# Patient Record
Sex: Male | Born: 1975 | Race: Black or African American | Hispanic: No | Marital: Single | Smoking: Never smoker
Health system: Southern US, Community
[De-identification: ages and names within clinical notes are randomized; demographics above are authoritative.]

## PROBLEM LIST (undated history)

## (undated) DIAGNOSIS — H409 Unspecified glaucoma: Secondary | ICD-10-CM

## (undated) HISTORY — DX: Unspecified glaucoma: H40.9

## (undated) HISTORY — PX: EYE SURGERY: SHX253

---

## 2009-06-18 ENCOUNTER — Encounter: Admission: RE | Admit: 2009-06-18 | Discharge: 2009-06-18 | Payer: Self-pay | Admitting: Internal Medicine

## 2010-09-15 ENCOUNTER — Other Ambulatory Visit: Payer: Self-pay | Admitting: Infectious Diseases

## 2010-09-15 ENCOUNTER — Ambulatory Visit
Admission: RE | Admit: 2010-09-15 | Discharge: 2010-09-15 | Disposition: A | Discharge status: Home / Self Care | Payer: PRIVATE HEALTH INSURANCE | Source: Ambulatory Visit | Attending: Infectious Diseases | Admitting: Infectious Diseases

## 2010-09-15 DIAGNOSIS — R7611 Nonspecific reaction to tuberculin skin test without active tuberculosis: Secondary | ICD-10-CM

## 2010-10-19 ENCOUNTER — Other Ambulatory Visit: Payer: Self-pay | Admitting: General Practice

## 2010-10-19 ENCOUNTER — Ambulatory Visit
Admission: RE | Admit: 2010-10-19 | Discharge: 2010-10-19 | Disposition: A | Discharge status: Home / Self Care | Payer: Self-pay | Source: Ambulatory Visit | Attending: General Practice | Admitting: General Practice

## 2010-10-19 DIAGNOSIS — M549 Dorsalgia, unspecified: Secondary | ICD-10-CM

## 2010-10-21 ENCOUNTER — Ambulatory Visit: Payer: Self-pay | Attending: General Practice

## 2010-10-21 DIAGNOSIS — M545 Low back pain, unspecified: Secondary | ICD-10-CM | POA: Insufficient documentation

## 2010-10-21 DIAGNOSIS — IMO0001 Reserved for inherently not codable concepts without codable children: Secondary | ICD-10-CM | POA: Insufficient documentation

## 2010-10-21 DIAGNOSIS — M546 Pain in thoracic spine: Secondary | ICD-10-CM | POA: Insufficient documentation

## 2010-10-21 DIAGNOSIS — M79609 Pain in unspecified limb: Secondary | ICD-10-CM | POA: Insufficient documentation
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
## 2016-09-26 ENCOUNTER — Emergency Department (HOSPITAL_COMMUNITY)
Admission: EM | Admit: 2016-09-26 | Discharge: 2016-09-27 | Disposition: A | Discharge status: Home / Self Care | Payer: Self-pay | Attending: Emergency Medicine | Admitting: Emergency Medicine

## 2016-09-26 ENCOUNTER — Encounter (HOSPITAL_COMMUNITY): Payer: Self-pay

## 2016-09-26 DIAGNOSIS — M5441 Lumbago with sciatica, right side: Secondary | ICD-10-CM | POA: Insufficient documentation

## 2016-09-26 MED ORDER — OXYCODONE-ACETAMINOPHEN 5-325 MG PO TABS
1.0000 | ORAL_TABLET | Freq: Once | ORAL | Status: AC
  Administered 2016-09-26: 1 via ORAL
  Filled 2016-09-26: qty 1

## 2016-09-26 NOTE — ED Provider Notes (Signed)
[facility] Provider Note   CSN: [id number] Arrival date & time: 09/26/16  1942     History   Chief Complaint Chief Complaint  Patient presents with  . Back Pain    HPI Patrick King is a 41 y.o. male.  The history is provided by the patient.  He complains of low back pain for the last 3 days. Pain radiates to the right leg and also to the right lateral abdomen. Pain does wax and wane. When he gets severe, he will have some chills. There is no nausea or vomiting. He denies any urinary difficulty. He denies weakness, numbness, tingling. He had been doing a lot of lifting because he was moving. He has not had back problems previously. He is taken acetaminophen at home without relief. He was given a dose of oxycodone-acetaminophen in the waiting room, with significant relief. He rates his pain currently at 5/10, but was 8/10 at its worst.  History reviewed. No pertinent past medical history.  There are no active problems to display for this patient.   History reviewed. No pertinent surgical history.     Home Medications    Prior to Admission medications   Not on File    Family History History reviewed. No pertinent family history.  Social History Social History  Substance Use Topics  . Smoking status: Never Smoker  . Smokeless tobacco: Never Used  . Alcohol use No     Allergies   Patient has no allergy information on record.   Review of Systems Review of Systems  All other systems reviewed and are negative.    Physical Exam Updated Vital Signs BP (!) 144/93 (BP Location: Left Arm)   Pulse 61   Temp 97.7 F (36.5 C) (Oral)   Resp 18   Ht 5\' 7"  (1.702 m)   Wt 90.3 kg (199 lb 1.6 oz)   SpO2 100%   BMI 31.18 kg/m   Physical Exam  Nursing note and vitals reviewed.  41 year old male, resting comfortably and in no acute distress. Vital signs are significant for hypertension. Oxygen saturation is 100%, which is normal. Head is normocephalic  and atraumatic. PERRLA, EOMI. Oropharynx is clear. Neck is nontender and supple without adenopathy or JVD. Back is nontender. There is moderate bilateral paralumbar spasm. Straight leg raise is negative. There is no CVA tenderness. Lungs are clear without rales, wheezes, or rhonchi. Chest is nontender. Heart has regular rate and rhythm without murmur. Abdomen is soft, flat, nontender without masses or hepatosplenomegaly and peristalsis is normoactive. Extremities have no cyanosis or edema, full range of motion is present. Skin is warm and dry without rash. Neurologic: Mental status is normal, cranial nerves are intact, there are no motor or sensory deficits.   Procedures Procedures (including critical care time)  Medications Ordered in ED Medications  ibuprofen (ADVIL,MOTRIN) tablet 800 mg (not administered)  cyclobenzaprine (FLEXERIL) tablet 10 mg (not administered)  oxyCODONE-acetaminophen (PERCOCET/ROXICET) 5-325 MG per tablet 1 tablet (1 tablet Oral Given 09/26/16 2115)     Initial Impression / Assessment and Plan / ED Course  I have reviewed the triage vital signs and the nursing notes.  Low back pain with right-sided sciatica. No red flags to suggest more serious problems. Old records are reviewed, and he has no relevant past visits. He is discharged with prescription for naproxen, orphenadrine, oxycodone have acetaminophen. Return precautions discussed. No indications for imaging today.  Final Clinical Impressions(s) / ED Diagnoses   Final diagnoses:  Acute bilateral  low back pain with right-sided sciatica    New Prescriptions New Prescriptions   NAPROXEN (NAPROSYN) 500 MG TABLET    Take 1 tablet (500 mg total) by mouth 2 (two) times daily.   ORPHENADRINE (NORFLEX) 100 MG TABLET    Take 1 tablet (100 mg total) by mouth 2 (two) times daily.   OXYCODONE-ACETAMINOPHEN (PERCOCET) 5-325 MG TABLET    Take 1 tablet by mouth every 4 (four) hours as needed for moderate pain.       Dione Booze, MD 09/27/16 [phone]

## 2016-09-26 NOTE — ED Triage Notes (Signed)
Pt states that he has been having bilateral flank pain that radiates down to rt  leg, and to right side of abdomen 8/10 shooting and sharp x 3 days pt denies pain with urination, hematuria, dysuria.  Pt does report recently moving over the weekend and  A lot of lifting. Denies nausea vomiting.

## 2016-09-27 MED ORDER — IBUPROFEN 800 MG PO TABS
800.0000 mg | ORAL_TABLET | Freq: Once | ORAL | Status: AC
  Administered 2016-09-27: 800 mg via ORAL
  Filled 2016-09-27: qty 1

## 2016-09-27 MED ORDER — ORPHENADRINE CITRATE ER 100 MG PO TB12: 100.0000 mg | ORAL_TABLET | Freq: Two times a day (BID) | ORAL | 0 refills | Status: DC

## 2016-09-27 MED ORDER — OXYCODONE-ACETAMINOPHEN 5-325 MG PO TABS: 1.0000 | ORAL_TABLET | ORAL | 0 refills | Status: DC | PRN

## 2016-09-27 MED ORDER — NAPROXEN 500 MG PO TABS: 500.0000 mg | ORAL_TABLET | Freq: Two times a day (BID) | ORAL | 0 refills | Status: DC

## 2016-09-27 MED ORDER — CYCLOBENZAPRINE HCL 10 MG PO TABS
10.0000 mg | ORAL_TABLET | Freq: Once | ORAL | Status: AC
  Administered 2016-09-27: 10 mg via ORAL
  Filled 2016-09-27: qty 1

## 2016-09-27 NOTE — Discharge Instructions (Signed)
Apply ice several times a day. Return if pain is getting worse, or if new symptoms arise.

## 2016-09-27 NOTE — ED Notes (Signed)
Pt ambulatory and independent at discharge.  Verbalized understanding of discharge instructions and importance of not driving while under the influence of pain medication. 

## 2017-02-21 ENCOUNTER — Ambulatory Visit: Payer: PRIVATE HEALTH INSURANCE | Admitting: Family Medicine

## 2017-05-04 ENCOUNTER — Encounter: Payer: Self-pay | Admitting: Family Medicine

## 2017-05-04 ENCOUNTER — Ambulatory Visit: Payer: Managed Care, Other (non HMO) | Admitting: Family Medicine

## 2017-05-04 VITALS — BP 110/72 | HR 75 | Ht 68.5 in | Wt 203.0 lb

## 2017-05-04 DIAGNOSIS — H409 Unspecified glaucoma: Secondary | ICD-10-CM | POA: Diagnosis not present

## 2017-05-04 DIAGNOSIS — R142 Eructation: Secondary | ICD-10-CM | POA: Insufficient documentation

## 2017-05-04 NOTE — Patient Instructions (Signed)
Take 1 Dexilant 30 minutes before a meal once daily.   Try to avoid chewing gum, carbonated beverages, eating quickly.   Foods to avoid:   Onions, cabbage, broccoli, brussel sprouts, wheat and potatoes.   Follow up in 2 weeks FASTING

## 2017-05-04 NOTE — Progress Notes (Signed)
   Subjective:    Patient ID: Patrick King, male    DOB: [birth year], 42 y.o.   MRN: [id number]  HPI Chief Complaint  Patient presents with  . new pt    new pt, burping issues- burping every minute, stomach pain once a year- that he can remember, needs update shot records- which he has not had done   He is new to the practice and here for an acute complaint.  Previous medical care: No PCP  Other providers: Eye doctor is Dr. Melene MullerWhittaker on [address]   Reports having glaucoma.   Complains of burping for the past 2 years without abdominal pain, bloating, nausea, vomiting, diarrhea or constipation. Denies history of reflux and denies any symptoms of indigestion.  No unexplained weight loss.  No new medications or antibiotic use.  Reports having " An African diet" states he eats a lot of rice, vegetables and spicy food. He drinks soda.  States his diet has not changed.  [place] from [place] ago.  States he has tried to stop chewing gum to see if this helped and it did not.  Denies trying an acid.  States he has not seen anyone for his symptoms.  Denies fever, chills, chest pain palpitations, cough, shortness of breath, orthopnea.  Reviewed allergies, medications, past medical, surgical, family, and social history.  Review of Systems Pertinent positives and negatives in the history of present illness.     Objective:   Physical Exam BP 110/72   Pulse 75   Ht 5' 8.5" (1.74 m)   Wt 203 lb (92.1 kg)   BMI 30.42 kg/m   Alert and in no distress. Pharyngeal area is normal. Neck is supple without adenopathy or thyromegaly. Cardiac exam shows a regular sinus rhythm without murmurs or gallops. Lungs are clear to auscultation.  Abdomen is soft, nontender, nondistended, normal bowel sounds, no palpable masses.  Extremities without edema, normal pulses.      Assessment & Plan:  Belching  Glaucoma, unspecified glaucoma type, unspecified laterality Exam is  unremarkable. Discussed trying to reduce the amount of gas producing foods and habits.  He will try a 10-day course of Dexilant.  Samples given. Plan to have him return in 2 weeks for a follow-up and fasting CPE per his request.
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
## 2017-05-24 ENCOUNTER — Encounter: Payer: Managed Care, Other (non HMO) | Admitting: Family Medicine

## 2017-05-26 ENCOUNTER — Encounter: Payer: Self-pay | Admitting: Family Medicine

## 2017-11-05 ENCOUNTER — Other Ambulatory Visit: Payer: Self-pay

## 2017-11-05 ENCOUNTER — Ambulatory Visit (INDEPENDENT_AMBULATORY_CARE_PROVIDER_SITE_OTHER): Payer: Managed Care, Other (non HMO)

## 2017-11-05 ENCOUNTER — Ambulatory Visit (HOSPITAL_COMMUNITY)
Admission: EM | Admit: 2017-11-05 | Discharge: 2017-11-05 | Disposition: A | Discharge status: Home / Self Care | Payer: Managed Care, Other (non HMO) | Attending: Internal Medicine | Admitting: Internal Medicine

## 2017-11-05 ENCOUNTER — Encounter (HOSPITAL_COMMUNITY): Payer: Self-pay | Admitting: Emergency Medicine

## 2017-11-05 DIAGNOSIS — M545 Low back pain, unspecified: Secondary | ICD-10-CM

## 2017-11-05 DIAGNOSIS — S161XXA Strain of muscle, fascia and tendon at neck level, initial encounter: Secondary | ICD-10-CM

## 2017-11-05 MED ORDER — MELOXICAM 7.5 MG PO TABS: 7.5000 mg | ORAL_TABLET | Freq: Every day | ORAL | 0 refills | Status: DC

## 2017-11-05 MED ORDER — METHOCARBAMOL 500 MG PO TABS: 500.0000 mg | ORAL_TABLET | Freq: Two times a day (BID) | ORAL | 0 refills | Status: DC | PRN

## 2017-11-05 NOTE — Discharge Instructions (Signed)
Light and regular activity as tolerated.  Heat and/or ice application may be helpful. Meloxicam once a day. Don't take additional ibuprofen or aleve.  Robaxin as a muscle relaxer twice a day as needed. May be helpful before bed. May cause drowsiness. Please do not take if driving or drinking alcohol.   If symptoms worsen or do not improve in the next 2-3 weeks to follow up with your PCP.
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
## 2019-01-17 ENCOUNTER — Ambulatory Visit: Payer: Managed Care, Other (non HMO) | Attending: Family Medicine | Admitting: Family Medicine

## 2019-01-17 ENCOUNTER — Other Ambulatory Visit: Payer: Self-pay

## 2020-07-14 ENCOUNTER — Ambulatory Visit: Payer: Managed Care, Other (non HMO) | Admitting: Family Medicine

## 2021-01-05 ENCOUNTER — Other Ambulatory Visit: Payer: Self-pay

## 2021-01-05 ENCOUNTER — Ambulatory Visit: Payer: Self-pay | Attending: Critical Care Medicine | Admitting: Critical Care Medicine

## 2021-01-05 ENCOUNTER — Encounter: Payer: Self-pay | Admitting: Critical Care Medicine

## 2021-01-05 VITALS — BP 128/80 | HR 52 | Resp 16 | Ht 69.0 in | Wt 192.0 lb

## 2021-01-05 DIAGNOSIS — Z1159 Encounter for screening for other viral diseases: Secondary | ICD-10-CM

## 2021-01-05 DIAGNOSIS — R142 Eructation: Secondary | ICD-10-CM

## 2021-01-05 DIAGNOSIS — Z114 Encounter for screening for human immunodeficiency virus [HIV]: Secondary | ICD-10-CM

## 2021-01-05 DIAGNOSIS — Z139 Encounter for screening, unspecified: Secondary | ICD-10-CM

## 2021-01-05 DIAGNOSIS — Z23 Encounter for immunization: Secondary | ICD-10-CM

## 2021-01-05 MED ORDER — OMEPRAZOLE 20 MG PO CPDR
20.0000 mg | DELAYED_RELEASE_CAPSULE | Freq: Every day | ORAL | 3 refills | Status: DC
  Filled 2021-01-05: qty 30, 30d supply, fill #0

## 2021-01-05 NOTE — Assessment & Plan Note (Addendum)
Rectal exam is negative stools heme-negative  Will obtain complete set of screening labs and given patient abdominal bloating educational sheet  For empiric treatment of reflux disease given reflux diet and also begin low-dose omeprazole 20 mg daily before breakfast

## 2021-01-05 NOTE — Patient Instructions (Signed)
Begin omeprazole one daily for acid/belching , take 1/2 hour before meal breakfast, sent to our pharmacy  Follow as best you can with your culture a reflux diet and follow guidelines on the abdominal bloating tip sheet  Apply for [place] discount, orange card  Complete  lab screen today  Flu vaccine given  We discussed Tdap

## 2021-01-05 NOTE — Assessment & Plan Note (Signed)
Complete set of labs will be obtained  Patient declined to receive the tetanus vaccine  Hepatitis C screening and HIV screening will occur

## 2021-01-05 NOTE — Progress Notes (Signed)
New Patient Office Visit  Subjective:  Patient ID: Patrick King, male    DOB: Dec 09, 1975  Age: 45 y.o. MRN: [id number]  CC:  Chief Complaint  Patient presents with   New Patient (Initial Visit)    HPI GJON LETARTE presents for primary care initial visit to establish.  Patient is from [place] but has been in this country for many years.  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  Patient does carry diagnosis of glaucoma and is on 2 different eyedrops and oral Diamox and followed closely by general ophthalmology.  Patient states his vision is at baseline.  On arrival blood pressure is good at 128/80.  Patient does complain of chronic belching but denies any other heartburn or other related GI symptoms.  He has not been treated for this in the past.  Patient has no other complaints at this time.  There is a history of hypertension that runs in his mother side of the family  Past Medical History:  Diagnosis Date   Glaucoma     Past Surgical History:  Procedure Laterality Date   EYE SURGERY      Family History  Problem Relation Age of Onset   Hypertension Mother     Social History   Socioeconomic History   Marital status: Single    Spouse name: Not on file   Number of children: Not on file   Years of education: Not on file   Highest education level: Not on file  Occupational History   Not on file  Tobacco Use   Smoking status: Never   Smokeless tobacco: Never  Substance and Sexual Activity   Alcohol use: No   Drug use: No   Sexual activity: Yes  Other Topics Concern   Not on file  Social History Narrative   Not on file   Social Determinants of Health   Financial Resource Strain: Not on file  Food Insecurity: Not on file  Transportation Needs: Not on file  Physical Activity: Not on file  Stress: Not on file  Social Connections: Not on file  Intimate Partner Violence: Not on file    ROS Review of Systems  Constitutional:  Negative.   HENT: Negative.    Eyes:  Positive for visual disturbance. Negative for photophobia, pain and redness.  Respiratory: Negative.    Cardiovascular: Negative.   Gastrointestinal:  Negative for abdominal distention, abdominal pain, anal bleeding, blood in stool, constipation, diarrhea, nausea, rectal pain and vomiting.       Belching frequent  Genitourinary: Negative.   Musculoskeletal: Negative.   Skin: Negative.   Neurological: Negative.  Negative for tremors, seizures, syncope and weakness.  Hematological: Negative.   Psychiatric/Behavioral: Negative.  Negative for dysphoric mood, sleep disturbance and suicidal ideas. The patient is not nervous/anxious.    Objective:   Today's Vitals: BP 128/80   Pulse (!) 52   Resp 16   Ht 5\' 9"  (1.753 m)   Wt 192 lb (87.1 kg)   SpO2 100%   BMI 28.35 kg/m   Physical Exam Vitals reviewed.  Constitutional:      Appearance: Normal appearance. He is well-developed. He is obese. He is not diaphoretic.  HENT:     Head: Normocephalic and atraumatic.     Nose: No nasal deformity, septal deviation, mucosal edema or rhinorrhea.     Right Sinus: No maxillary sinus tenderness or frontal sinus tenderness.     Left Sinus: No  maxillary sinus tenderness or frontal sinus tenderness.     Mouth/Throat:     Pharynx: No oropharyngeal exudate.  Eyes:     General: No scleral icterus.    Conjunctiva/sclera: Conjunctivae normal.     Pupils: Pupils are equal, round, and reactive to light.  Neck:     Thyroid: No thyromegaly.     Vascular: No carotid bruit or JVD.     Trachea: Trachea normal. No tracheal tenderness or tracheal deviation.  Cardiovascular:     Rate and Rhythm: Normal rate and regular rhythm.     Chest Wall: PMI is not displaced.     Pulses: Normal pulses. No decreased pulses.     Heart sounds: Normal heart sounds, S1 normal and S2 normal. Heart sounds not distant. No murmur heard. No systolic murmur is present.  No diastolic murmur is  present.    No friction rub. No gallop. No S3 or S4 sounds.  Pulmonary:     Effort: Pulmonary effort is normal. No tachypnea, accessory muscle usage or respiratory distress.     Breath sounds: Normal breath sounds. No stridor. No decreased breath sounds, wheezing, rhonchi or rales.  Chest:     Chest wall: No tenderness.  Abdominal:     General: Bowel sounds are normal. There is no distension.     Palpations: Abdomen is soft. Abdomen is not rigid. There is no mass.     Tenderness: There is no abdominal tenderness. There is no guarding or rebound.     Hernia: No hernia is present.     Comments: Patient had a large amount of gas with belching when I compressed his stomach  Musculoskeletal:        General: Normal range of motion.     Cervical back: Normal range of motion and neck supple. No edema, erythema or rigidity. No muscular tenderness. Normal range of motion.  Lymphadenopathy:     Head:     Right side of head: No submental or submandibular adenopathy.     Left side of head: No submental or submandibular adenopathy.     Cervical: No cervical adenopathy.  Skin:    General: Skin is warm and dry.     Coloration: Skin is not pale.     Findings: No rash.     Nails: There is no clubbing.  Neurological:     Mental Status: He is alert and oriented to person, place, and time.     Sensory: No sensory deficit.  Psychiatric:        Mood and Affect: Mood normal.        Speech: Speech normal.        Behavior: Behavior normal.        Thought Content: Thought content normal.        Judgment: Judgment normal.    Assessment & Plan:   Problem List Items Addressed This Visit       Other   Eructation - Primary    Rectal exam is negative stools heme-negative  Will obtain complete set of screening labs and given patient abdominal bloating educational sheet  For empiric treatment of reflux disease given reflux diet and also begin low-dose omeprazole 20 mg daily before breakfast       Encounter for health-related screening    Complete set of labs will be obtained  Patient declined to receive the tetanus vaccine  Hepatitis C screening and HIV screening will occur      Relevant Orders   Comprehensive metabolic  panel   CBC with Differential/Platelet   Thyroid Panel With TSH   Lipid panel   Hemoglobin A1c   Other Visit Diagnoses     Need for immunization against influenza       Relevant Orders   Flu Vaccine QUAD 60mo+IM (Fluarix, Fluzone & Alfiuria Quad PF) (Completed)   Need for hepatitis C screening test       Relevant Orders   HCV Ab w Reflex to Quant PCR   Encounter for screening for HIV       Relevant Orders   HIV Antibody (routine testing w rflx)       Outpatient Encounter Medications as of 01/05/2021  Medication Sig   acetaZOLAMIDE ER (DIAMOX) 500 MG capsule Take 500 mg by mouth 2 (two) times daily.   ALPHAGAN P 0.1 % SOLN Place 1 drop into the left eye 3 (three) times daily.   omeprazole (PRILOSEC) 20 MG capsule Take 1 capsule (20 mg total) by mouth daily.   pilocarpine (PILOCAR) 2 % ophthalmic solution Place 1 drop into both eyes 3 (three) times daily.   [DISCONTINUED] acetaZOLAMIDE (DIAMOX) 250 MG tablet Take 250 mg by mouth 2 (two) times daily.    [DISCONTINUED] meloxicam (MOBIC) 7.5 MG tablet Take 1 tablet (7.5 mg total) by mouth daily.   [DISCONTINUED] methocarbamol (ROBAXIN) 500 MG tablet Take 1 tablet (500 mg total) by mouth 2 (two) times daily as needed for muscle spasms.   No facility-administered encounter medications on file as of 01/05/2021.    Follow-up: Return in about 3 months (around 04/07/2021).   Shan Levans, MD

## 2021-01-11 ENCOUNTER — Telehealth: Payer: Self-pay | Admitting: Critical Care Medicine

## 2021-01-11 DIAGNOSIS — R142 Eructation: Secondary | ICD-10-CM

## 2021-01-11 NOTE — Telephone Encounter (Signed)
Pt called in for assistance. Pt says that his Rx for omeprazole (PRILOSEC) 20 MG capsule   should have went to his local [facility] (below) instead.   Pt would like to know if provider could send Rx to correct pharmacy?      Pharmacy:  [facility] [address] [place]), [place] - [address] Phone:  [phone]  Fax:  [phone]

## 2021-01-12 ENCOUNTER — Other Ambulatory Visit: Payer: Self-pay

## 2021-01-12 MED ORDER — OMEPRAZOLE 20 MG PO CPDR
20.0000 mg | DELAYED_RELEASE_CAPSULE | Freq: Every day | ORAL | 3 refills | Status: DC
  Filled 2021-01-12: qty 30, 30d supply, fill #0
  Filled 2021-02-10: qty 30, 30d supply, fill #1
  Filled 2021-03-22: qty 30, 30d supply, fill #0
  Filled 2021-04-23: qty 30, 30d supply, fill #1

## 2021-01-12 MED ORDER — OMEPRAZOLE 20 MG PO CPDR: 20.0000 mg | DELAYED_RELEASE_CAPSULE | Freq: Every day | ORAL | 3 refills | Status: DC

## 2021-01-12 NOTE — Telephone Encounter (Signed)
Patient called in says he wants med to go to [facility], I directed hi them for the request.

## 2021-01-12 NOTE — Telephone Encounter (Signed)
Pt stated he contacted the pharmacy and has still not been able to pick up this Rx, please advise.

## 2021-01-12 NOTE — Telephone Encounter (Addendum)
Patient will need to call pharmacy for additional changes or to switch pharmacy.   Called [facility] to cancel refill.

## 2021-01-12 NOTE — Addendum Note (Signed)
Addended by: Guy Franco on: 01/12/2021 01:43 PM   Modules accepted: Orders

## 2021-01-13 ENCOUNTER — Other Ambulatory Visit: Payer: Self-pay

## 2021-02-10 ENCOUNTER — Other Ambulatory Visit: Payer: Self-pay

## 2021-02-11 ENCOUNTER — Other Ambulatory Visit: Payer: Self-pay

## 2021-03-22 ENCOUNTER — Other Ambulatory Visit: Payer: Self-pay

## 2021-03-23 ENCOUNTER — Other Ambulatory Visit (HOSPITAL_COMMUNITY): Payer: Self-pay

## 2021-03-23 ENCOUNTER — Other Ambulatory Visit: Payer: Self-pay

## 2021-04-23 ENCOUNTER — Other Ambulatory Visit: Payer: Self-pay

## 2021-05-06 ENCOUNTER — Ambulatory Visit: Payer: Self-pay | Admitting: Critical Care Medicine

## 2021-05-24 ENCOUNTER — Other Ambulatory Visit: Payer: Self-pay | Admitting: Critical Care Medicine

## 2021-05-24 DIAGNOSIS — R142 Eructation: Secondary | ICD-10-CM

## 2021-05-24 NOTE — Telephone Encounter (Signed)
Medication Refill - Medication: omeprazole (PRILOSEC) 20 MG capsule ? ?Has the patient contacted their pharmacy? Yes.   ?(Agent: If no, request that the patient contact the pharmacy for the refill. If patient does not wish to contact the pharmacy document the reason why and proceed with request.) ?(Agent: If yes, when and what did the pharmacy advise?) ? ?Preferred Pharmacy (with phone number or street name):  ?[facility] Phone:  [phone]  ?Fax:  [phone]  ?  ? ?Has the patient been seen for an appointment in the last year OR does the patient have an upcoming appointment? Yes.   ? ?Agent: Please be advised that RX refills may take up to 3 business days. We ask that you follow-up with your pharmacy.  ?

## 2021-05-25 ENCOUNTER — Other Ambulatory Visit: Payer: Self-pay

## 2021-05-25 MED ORDER — OMEPRAZOLE 20 MG PO CPDR
20.0000 mg | DELAYED_RELEASE_CAPSULE | Freq: Every day | ORAL | 3 refills | Status: DC
  Filled 2021-05-25: qty 30, 30d supply, fill #0
  Filled 2021-07-06: qty 30, 30d supply, fill #1

## 2021-05-25 NOTE — Telephone Encounter (Signed)
Requested Prescriptions  ?Pending Prescriptions Disp Refills  ?? omeprazole (PRILOSEC) 20 MG capsule 30 capsule 3  ?  Sig: Take 1 capsule (20 mg total) by mouth daily.  ?  ? Gastroenterology: Proton Pump Inhibitors Passed - 05/24/2021 12:39 PM  ?  ?  Passed - Valid encounter within last 12 months  ?  Recent Outpatient Visits   ?      ? 4 months ago [place]  ? [facility] Storm Frisk, MD  ?  ?  ?Future Appointments   ?        ? In 2 weeks Storm Frisk, MD [facility]  ?  ? ?  ?  ?  ? ?

## 2021-05-26 ENCOUNTER — Other Ambulatory Visit: Payer: Self-pay

## 2021-06-09 ENCOUNTER — Ambulatory Visit: Payer: Self-pay | Admitting: Critical Care Medicine

## 2021-07-06 ENCOUNTER — Other Ambulatory Visit: Payer: Self-pay

## 2021-07-07 ENCOUNTER — Other Ambulatory Visit: Payer: Self-pay

## 2021-08-11 ENCOUNTER — Encounter: Payer: Self-pay | Admitting: Critical Care Medicine

## 2021-08-11 ENCOUNTER — Other Ambulatory Visit: Payer: Self-pay

## 2021-08-11 ENCOUNTER — Ambulatory Visit: Payer: Self-pay | Attending: Critical Care Medicine | Admitting: Critical Care Medicine

## 2021-08-11 VITALS — BP 131/76 | HR 54 | Temp 98.2°F | Ht 69.0 in | Wt 188.4 lb

## 2021-08-11 DIAGNOSIS — Z1159 Encounter for screening for other viral diseases: Secondary | ICD-10-CM

## 2021-08-11 DIAGNOSIS — Z1211 Encounter for screening for malignant neoplasm of colon: Secondary | ICD-10-CM

## 2021-08-11 DIAGNOSIS — Z139 Encounter for screening, unspecified: Secondary | ICD-10-CM

## 2021-08-11 DIAGNOSIS — Z114 Encounter for screening for human immunodeficiency virus [HIV]: Secondary | ICD-10-CM

## 2021-08-11 DIAGNOSIS — R142 Eructation: Secondary | ICD-10-CM

## 2021-08-11 MED ORDER — LIDOCAINE 5 % EX PTCH
1.0000 | MEDICATED_PATCH | CUTANEOUS | 0 refills | Status: DC
  Filled 2021-08-11: qty 30, 30d supply, fill #0

## 2021-08-11 NOTE — Assessment & Plan Note (Signed)
Health screenings will be reissued

## 2021-08-11 NOTE — Assessment & Plan Note (Signed)
Patient's not responded to standard treatments and dietary change will refer to gastroenterology to discontinue further reflux medicine

## 2021-08-11 NOTE — Patient Instructions (Signed)
Obtain health screening labs at this visit and also pick up colon cancer screening kit  Discontinue further omeprazole  Referral to gastroenterology we made  Lidocaine patch will be issued for your low back pain and follow back exercises on attachment  Return to see Dr. Joya Gaskins 4 months

## 2021-08-11 NOTE — Progress Notes (Signed)
States he still is burping.

## 2021-08-11 NOTE — Progress Notes (Signed)
New Patient Office Visit  Subjective:  Patient ID: Patrick King, male    DOB: 1975/02/19  Age: 46 y.o. MRN: [id number]  CC:  Chief Complaint  Patient presents with   Back Pain    HPI BRYSEN SHANKMAN presents for primary care .   01/05/2021 Patient is from [place] but has been in this country for many years.  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  Patient does carry diagnosis of glaucoma and is on 2 different eyedrops and oral Diamox and followed closely by general ophthalmology.  Patient states his vision is at baseline.  On arrival blood pressure is good at 128/80.  Patient does complain of chronic belching but denies any other heartburn or other related GI symptoms.  He has not been treated for this in the past.  Patient has no other complaints at this time.  There is a history of hypertension that runs in his mother side of the family  6/28 Patient seen in return visit complaining of continued eructation that is persistent.  He is following a lactose-free diet he has tried eliminate most of the common precipitants of excess upper intestinal gas.  He has no lower intestinal gas.  He has had this problem since 2007.  He has not seen gastroenterology as of yet.  Reflux medications are of no benefit.  Patient had not gotten his health screening labs at the last visit in November will need to be ordered at this visit Past Medical History:  Diagnosis Date   Glaucoma     Past Surgical History:  Procedure Laterality Date   EYE SURGERY      Family History  Problem Relation Age of Onset   Hypertension Mother     Social History   Socioeconomic History   Marital status: Single    Spouse name: Not on file   Number of children: Not on file   Years of education: Not on file   Highest education level: Not on file  Occupational History   Not on file  Tobacco Use   Smoking status: Never   Smokeless tobacco: Never  Substance and Sexual Activity    Alcohol use: No   Drug use: No   Sexual activity: Yes  Other Topics Concern   Not on file  Social History Narrative   Not on file   Social Determinants of Health   Financial Resource Strain: Not on file  Food Insecurity: Not on file  Transportation Needs: Not on file  Physical Activity: Not on file  Stress: Not on file  Social Connections: Not on file  Intimate Partner Violence: Not on file    ROS Review of Systems  Constitutional: Negative.  Negative for chills, diaphoresis and fever.  HENT: Negative.  Negative for congestion, hearing loss, nosebleeds, sore throat and tinnitus.   Eyes:  Negative for photophobia, pain, redness and visual disturbance.  Respiratory: Negative.  Negative for cough, shortness of breath, wheezing and stridor.   Cardiovascular: Negative.  Negative for chest pain, palpitations and leg swelling.  Gastrointestinal:  Negative for abdominal distention, abdominal pain, anal bleeding, blood in stool, constipation, diarrhea, nausea, rectal pain and vomiting.       Belching frequent  Endocrine: Negative for polydipsia.  Genitourinary: Negative.  Negative for dysuria, flank pain, frequency, hematuria and urgency.  Musculoskeletal: Negative.  Negative for back pain, myalgias and neck pain.  Skin: Negative.  Negative for rash.  Allergic/Immunologic: Negative for environmental  allergies.  Neurological: Negative.  Negative for dizziness, tremors, seizures, syncope, weakness and headaches.  Hematological: Negative.  Does not bruise/bleed easily.  Psychiatric/Behavioral: Negative.  Negative for dysphoric mood, sleep disturbance and suicidal ideas. The patient is not nervous/anxious.     Objective:   Today's Vitals: BP 131/76   Pulse (!) 54   Temp 98.2 F (36.8 C) (Oral)   Ht 5\' 9"  (1.753 m)   Wt 188 lb 6.4 oz (85.5 kg)   SpO2 100%   BMI 27.82 kg/m   Physical Exam Vitals reviewed.  Constitutional:      Appearance: Normal appearance. He is well-developed.  He is obese. He is not diaphoretic.  HENT:     Head: Normocephalic and atraumatic.     Nose: No nasal deformity, septal deviation, mucosal edema or rhinorrhea.     Right Sinus: No maxillary sinus tenderness or frontal sinus tenderness.     Left Sinus: No maxillary sinus tenderness or frontal sinus tenderness.     Mouth/Throat:     Pharynx: No oropharyngeal exudate.  Eyes:     General: No scleral icterus.    Conjunctiva/sclera: Conjunctivae normal.     Pupils: Pupils are equal, round, and reactive to light.  Neck:     Thyroid: No thyromegaly.     Vascular: No carotid bruit or JVD.     Trachea: Trachea normal. No tracheal tenderness or tracheal deviation.  Cardiovascular:     Rate and Rhythm: Normal rate and regular rhythm.     Chest Wall: PMI is not displaced.     Pulses: Normal pulses. No decreased pulses.     Heart sounds: Normal heart sounds, S1 normal and S2 normal. Heart sounds not distant. No murmur heard.    No systolic murmur is present.     No diastolic murmur is present.     No friction rub. No gallop. No S3 or S4 sounds.  Pulmonary:     Effort: Pulmonary effort is normal. No tachypnea, accessory muscle usage or respiratory distress.     Breath sounds: Normal breath sounds. No stridor. No decreased breath sounds, wheezing, rhonchi or rales.  Chest:     Chest wall: No tenderness.  Abdominal:     General: Bowel sounds are normal. There is no distension.     Palpations: Abdomen is soft. Abdomen is not rigid. There is no mass.     Tenderness: There is no abdominal tenderness. There is no guarding or rebound.     Hernia: No hernia is present.     Comments: Patient had a large amount of gas with belching when I compressed his stomach  Musculoskeletal:        General: Normal range of motion.     Cervical back: Normal range of motion and neck supple. No edema, erythema or rigidity. No muscular tenderness. Normal range of motion.  Lymphadenopathy:     Head:     Right side of  head: No submental or submandibular adenopathy.     Left side of head: No submental or submandibular adenopathy.     Cervical: No cervical adenopathy.  Skin:    General: Skin is warm and dry.     Coloration: Skin is not pale.     Findings: No rash.     Nails: There is no clubbing.  Neurological:     Mental Status: He is alert and oriented to person, place, and time.     Sensory: No sensory deficit.  Psychiatric:  Mood and Affect: Mood normal.        Speech: Speech normal.        Behavior: Behavior normal.        Thought Content: Thought content normal.        Judgment: Judgment normal.     Assessment & Plan:   Problem List Items Addressed This Visit       Other   Eructation    Patient's not responded to standard treatments and dietary change will refer to gastroenterology to discontinue further reflux medicine      Relevant Orders   CBC with Differential/Platelet   Ambulatory referral to Gastroenterology   Encounter for health-related screening    Health screenings will be reissued      Relevant Orders   Hemoglobin A1c   Comprehensive metabolic panel   CBC with Differential/Platelet   Lipid panel   Other Visit Diagnoses     Colon cancer screening    -  Primary   Relevant Orders   Fecal occult blood, imunochemical   Need for hepatitis C screening test       Relevant Orders   HCV Ab w Reflex to Quant PCR   Encounter for screening for HIV       Relevant Orders   HIV Antibody (routine testing w rflx)      Outpatient Encounter Medications as of 08/11/2021  Medication Sig   acetaZOLAMIDE ER (DIAMOX) 500 MG capsule Take 500 mg by mouth 2 (two) times daily.   ALPHAGAN P 0.1 % SOLN Place 1 drop into the left eye 3 (three) times daily.   lidocaine (LIDODERM) 5 % Place 1 patch onto the skin daily. Remove & Discard patch within 12 hours or as directed by MD   pilocarpine (PILOCAR) 2 % ophthalmic solution Place 1 drop into both eyes 3 (three) times daily.    [DISCONTINUED] omeprazole (PRILOSEC) 20 MG capsule Take 1 capsule (20 mg total) by mouth daily.   dorzolamide-timolol (COSOPT) 22.3-6.8 MG/ML ophthalmic solution 1 drop 2 (two) times daily.   No facility-administered encounter medications on file as of 08/11/2021.    Follow-up: 4 mo  Shan Levans, MD

## 2021-08-12 ENCOUNTER — Telehealth: Payer: Self-pay

## 2021-08-12 ENCOUNTER — Other Ambulatory Visit: Payer: Self-pay

## 2021-08-12 ENCOUNTER — Other Ambulatory Visit: Payer: Self-pay | Admitting: Critical Care Medicine

## 2021-08-12 DIAGNOSIS — N1832 Chronic kidney disease, stage 3b: Secondary | ICD-10-CM | POA: Insufficient documentation

## 2021-08-12 LAB — COMPREHENSIVE METABOLIC PANEL
ALT: 13 IU/L (ref 0–44)
AST: 24 IU/L (ref 0–40)
Albumin/Globulin Ratio: 1.6 (ref 1.2–2.2)
Albumin: 4.9 g/dL (ref 4.0–5.0)
Alkaline Phosphatase: 84 IU/L (ref 44–121)
BUN/Creatinine Ratio: 8 — ABNORMAL LOW (ref 9–20)
BUN: 19 mg/dL (ref 6–24)
Bilirubin Total: 0.3 mg/dL (ref 0.0–1.2)
CO2: 18 mmol/L — ABNORMAL LOW (ref 20–29)
Calcium: 9.5 mg/dL (ref 8.7–10.2)
Chloride: 104 mmol/L (ref 96–106)
Creatinine, Ser: 2.35 mg/dL — ABNORMAL HIGH (ref 0.76–1.27)
Globulin, Total: 3 g/dL (ref 1.5–4.5)
Glucose: 86 mg/dL (ref 70–99)
Potassium: 4.2 mmol/L (ref 3.5–5.2)
Sodium: 137 mmol/L (ref 134–144)
Total Protein: 7.9 g/dL (ref 6.0–8.5)
eGFR: 34 mL/min/{1.73_m2} — ABNORMAL LOW (ref >59)

## 2021-08-12 LAB — CBC WITH DIFFERENTIAL/PLATELET
Basophils Absolute: 0 10*3/uL (ref 0.0–0.2)
Basos: 1 %
EOS (ABSOLUTE): 0 10*3/uL (ref 0.0–0.4)
Eos: 1 %
Hematocrit: 42.6 % (ref 37.5–51.0)
Hemoglobin: 13.9 g/dL (ref 13.0–17.7)
Immature Grans (Abs): 0 10*3/uL (ref 0.0–0.1)
Immature Granulocytes: 0 %
Lymphocytes Absolute: 1.5 10*3/uL (ref 0.7–3.1)
Lymphs: 36 %
MCH: 28.5 pg (ref 26.6–33.0)
MCHC: 32.6 g/dL (ref 31.5–35.7)
MCV: 88 fL (ref 79–97)
Monocytes Absolute: 0.4 10*3/uL (ref 0.1–0.9)
Monocytes: 9 %
Neutrophils Absolute: 2.2 10*3/uL (ref 1.4–7.0)
Neutrophils: 53 %
Platelets: 258 10*3/uL (ref 150–450)
RBC: 4.87 x10E6/uL (ref 4.14–5.80)
RDW: 12.9 % (ref 11.6–15.4)
WBC: 4.1 10*3/uL (ref 3.4–10.8)

## 2021-08-12 LAB — LIPID PANEL
Chol/HDL Ratio: 3 ratio (ref 0.0–5.0)
Cholesterol, Total: 213 mg/dL — ABNORMAL HIGH (ref 100–199)
HDL: 72 mg/dL (ref >39)
LDL Chol Calc (NIH): 129 mg/dL — ABNORMAL HIGH (ref 0–99)
Triglycerides: 66 mg/dL (ref 0–149)
VLDL Cholesterol Cal: 12 mg/dL (ref 5–40)

## 2021-08-12 LAB — HCV AB W REFLEX TO QUANT PCR: HCV Ab: NONREACTIVE

## 2021-08-12 LAB — HEMOGLOBIN A1C
Est. average glucose Bld gHb Est-mCnc: 120 mg/dL
Hgb A1c MFr Bld: 5.8 % — ABNORMAL HIGH (ref 4.8–5.6)

## 2021-08-12 LAB — HCV INTERPRETATION

## 2021-08-12 LAB — HIV ANTIBODY (ROUTINE TESTING W REFLEX): HIV Screen 4th Generation wRfx: NONREACTIVE

## 2021-08-12 MED ORDER — ATORVASTATIN CALCIUM 10 MG PO TABS
10.0000 mg | ORAL_TABLET | Freq: Every day | ORAL | 3 refills | Status: DC
  Filled 2021-08-12: qty 90, 90d supply, fill #0

## 2021-08-12 NOTE — Progress Notes (Signed)
Let pt know his kidney function is weak he needs to avoid NSAIDs eg ibuprofen / aleve  use tylenol for pain   he NEEDS to get his approval for orange card so I can send him to kidney doctor,   A1C is slightly high: mail him lifestyle medicine handout english, cholesterol very high I sent cholesterol pill to our pharmacy, hep C NEG, blood counts normal

## 2021-08-12 NOTE — Telephone Encounter (Signed)
-----   Message from Storm Frisk, MD sent at 08/12/2021  6:03 AM EDT ----- Let pt know his kidney function is weak he needs to avoid NSAIDs eg ibuprofen / aleve  use tylenol for pain   he NEEDS to get his approval for orange card so I can send him to kidney doctor,   A1C is slightly high: mail him lifestyle medicine handout english, cholesterol very high I sent cholesterol pill to our pharmacy, hep C NEG, blood counts normal

## 2021-08-12 NOTE — Telephone Encounter (Signed)
Pt was called and is aware of results, DOB was confirmed.  ?

## 2021-08-13 ENCOUNTER — Other Ambulatory Visit: Payer: Self-pay

## 2021-09-29 ENCOUNTER — Other Ambulatory Visit: Payer: Self-pay

## 2021-09-29 ENCOUNTER — Other Ambulatory Visit: Payer: Self-pay | Admitting: Critical Care Medicine

## 2021-09-29 MED ORDER — LIDOCAINE 5 % EX PTCH
1.0000 | MEDICATED_PATCH | CUTANEOUS | 0 refills | Status: DC
  Filled 2021-09-29: qty 30, 30d supply, fill #0

## 2021-09-30 ENCOUNTER — Other Ambulatory Visit: Payer: Self-pay

## 2021-12-13 ENCOUNTER — Ambulatory Visit: Payer: Self-pay | Admitting: Critical Care Medicine

## 2021-12-13 ENCOUNTER — Ambulatory Visit: Payer: Self-pay | Admitting: Nurse Practitioner

## 2022-01-07 ENCOUNTER — Other Ambulatory Visit: Payer: Self-pay

## 2022-01-07 ENCOUNTER — Other Ambulatory Visit: Payer: Self-pay | Admitting: Critical Care Medicine

## 2022-01-10 ENCOUNTER — Other Ambulatory Visit: Payer: Self-pay

## 2022-01-10 MED ORDER — LIDOCAINE 5 % EX PTCH
1.0000 | MEDICATED_PATCH | CUTANEOUS | 0 refills | Status: DC
  Filled 2022-01-10 – 2022-01-28 (×3): qty 30, 30d supply, fill #0

## 2022-01-10 NOTE — Telephone Encounter (Signed)
Requested Prescriptions  Pending Prescriptions Disp Refills   lidocaine (LIDODERM) 5 % 90 patch 0    Sig: Place 1 patch onto the skin daily. Remove & Discard patch within 12 hours or as directed by MD     Analgesics:  Topicals Failed - 01/07/2022  9:27 AM      Failed - Manual Review: Labs are only required if the patient has taken medication for more than 8 weeks.      Failed - Cr in normal range and within 360 days    Creatinine, Ser  Date Value Ref Range Status  08/11/2021 2.35 (H) 0.76 - 1.27 mg/dL Final         Passed - PLT in normal range and within 360 days    Platelets  Date Value Ref Range Status  08/11/2021 258 150 - 450 x10E3/uL Final         Passed - HGB in normal range and within 360 days    Hemoglobin  Date Value Ref Range Status  08/11/2021 13.9 13.0 - 17.7 g/dL Final         Passed - HCT in normal range and within 360 days    Hematocrit  Date Value Ref Range Status  08/11/2021 42.6 37.5 - 51.0 % Final         Passed - eGFR is 30 or above and within 360 days    eGFR  Date Value Ref Range Status  08/11/2021 34 (L) >59 mL/min/1.73 Final         Passed - Patient is not pregnant      Passed - Valid encounter within last 12 months    Recent Outpatient Visits           5 months ago [place], Patrick E, MD   1 year ago [place], MD       Future Appointments             In 3 days Elsie Stain, MD [place]

## 2022-01-13 ENCOUNTER — Ambulatory Visit: Payer: Self-pay | Admitting: Critical Care Medicine

## 2022-01-17 ENCOUNTER — Other Ambulatory Visit: Payer: Self-pay

## 2022-01-20 ENCOUNTER — Other Ambulatory Visit: Payer: Self-pay

## 2022-01-26 ENCOUNTER — Other Ambulatory Visit: Payer: Self-pay

## 2022-01-28 ENCOUNTER — Other Ambulatory Visit: Payer: Self-pay

## 2022-03-17 ENCOUNTER — Ambulatory Visit: Payer: Self-pay | Admitting: Critical Care Medicine

## 2022-03-23 ENCOUNTER — Ambulatory Visit: Payer: Self-pay | Attending: Critical Care Medicine | Admitting: Critical Care Medicine

## 2022-03-23 ENCOUNTER — Encounter: Payer: Self-pay | Admitting: Critical Care Medicine

## 2022-03-23 ENCOUNTER — Other Ambulatory Visit: Payer: Self-pay

## 2022-03-23 VITALS — BP 108/66 | HR 67 | Temp 98.0°F | Ht 69.0 in | Wt 189.0 lb

## 2022-03-23 DIAGNOSIS — Z1211 Encounter for screening for malignant neoplasm of colon: Secondary | ICD-10-CM

## 2022-03-23 DIAGNOSIS — H409 Unspecified glaucoma: Secondary | ICD-10-CM

## 2022-03-23 DIAGNOSIS — N1832 Chronic kidney disease, stage 3b: Secondary | ICD-10-CM

## 2022-03-23 DIAGNOSIS — E782 Mixed hyperlipidemia: Secondary | ICD-10-CM

## 2022-03-23 DIAGNOSIS — R142 Eructation: Secondary | ICD-10-CM

## 2022-03-23 DIAGNOSIS — Z23 Encounter for immunization: Secondary | ICD-10-CM

## 2022-03-23 MED ORDER — LIDOCAINE 5 % EX PTCH
1.0000 | MEDICATED_PATCH | CUTANEOUS | 0 refills | Status: DC
  Filled 2022-03-23: qty 30, 30d supply, fill #0
  Filled 2022-04-13: qty 90, 90d supply, fill #0

## 2022-03-23 MED ORDER — ATORVASTATIN CALCIUM 10 MG PO TABS
10.0000 mg | ORAL_TABLET | Freq: Every day | ORAL | 3 refills | Status: DC
  Filled 2022-03-23: qty 90, 90d supply, fill #0

## 2022-03-23 MED ORDER — ROCKLATAN 0.02-0.005 % OP SOLN
1.0000 [drp] | Freq: Every day | OPHTHALMIC | 4 refills | Status: DC
  Filled 2022-03-23 (×2): qty 2.5, 50d supply, fill #0
  Filled 2022-06-03: qty 2.5, 50d supply, fill #1
  Filled 2022-07-14: qty 2.5, 50d supply, fill #2

## 2022-03-23 MED ORDER — ACETAZOLAMIDE ER 500 MG PO CP12
500.0000 mg | ORAL_CAPSULE | Freq: Two times a day (BID) | ORAL | 1 refills | Status: DC
  Filled 2022-03-23: qty 60, 30d supply, fill #0
  Filled 2022-04-13 – 2022-04-27 (×2): qty 60, 30d supply, fill #1
  Filled 2022-06-03: qty 60, 30d supply, fill #2
  Filled 2022-07-14 (×2): qty 60, 30d supply, fill #3
  Filled 2022-08-16: qty 60, 30d supply, fill #4
  Filled 2022-09-12: qty 60, 30d supply, fill #5

## 2022-03-23 MED ORDER — DORZOLAMIDE HCL 2 % OP SOLN
1.0000 [drp] | Freq: Two times a day (BID) | OPHTHALMIC | 4 refills | Status: DC
  Filled 2022-03-23: qty 10, 100d supply, fill #0
  Filled 2022-07-14: qty 10, 100d supply, fill #1

## 2022-03-23 MED ORDER — BRIMONIDINE TARTRATE 0.15 % OP SOLN
1.0000 [drp] | Freq: Three times a day (TID) | OPHTHALMIC | 12 refills | Status: DC
  Filled 2022-03-23: qty 5, 17d supply, fill #0
  Filled 2022-04-13: qty 5, 17d supply, fill #1
  Filled 2022-04-27 (×2): qty 5, 17d supply, fill #2
  Filled 2022-06-03: qty 5, 17d supply, fill #3
  Filled 2022-06-16: qty 5, 17d supply, fill #4

## 2022-03-23 NOTE — Patient Instructions (Signed)
Referral to gastroenterology was made the on the look out for a [phone]:  that is them calling you  Labs today include cholesterol and metabolic panel  Another cancer screening kit will be given to you today  Flu vaccine given  Refills on all current medications including eyedrops sent to our pharmacy downstairs  Return to see Dr. Joya Gaskins 5 months

## 2022-03-23 NOTE — Assessment & Plan Note (Signed)
Reassess lipids continue statins

## 2022-03-23 NOTE — Progress Notes (Signed)
New Patient Office Visit  Subjective:  Patient ID: Patrick King, male    DOB: March 18, 1975  Age: 47 y.o. MRN: [id number]  CC:  Chief Complaint  Patient presents with   Chronic Kidney Disease    Kidney f/u. Med refill.  Yes to flu vax.     HPI Patrick King presents for primary care .   01/05/2021 Patient is from [place] but has been in this country for many years.  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  He is uninsured.  Patient does carry diagnosis of glaucoma and is on 2 different eyedrops and oral Diamox and followed closely by general ophthalmology.  Patient states his vision is at baseline.  On arrival blood pressure is good at 128/80.  Patient does complain of chronic belching but denies any other heartburn or other related GI symptoms.  He has not been treated for this in the past.  Patient has no other complaints at this time.  There is a history of hypertension that runs in his mother side of the family  6/28 Patient seen in return visit complaining of continued eructation that is persistent.  He is following a lactose-free diet he has tried eliminate most of the common precipitants of excess upper intestinal gas.  He has no lower intestinal gas.  He has had this problem since 2007.  He has not seen gastroenterology as of yet.  Reflux medications are of no benefit.  Patient had not gotten his health screening labs at the last visit in November will need to be ordered at this visit  03/23/22 this patient is seen in follow-up from a visit in June of last year.  He is still having belching and burping.  We referred him to gastroenterology but he did not answer his phone and the appointment that was never made. The patient also failed to do his colon cancer screening kit will now get this through gastroenterology Patient is followed by ophthalmology for ocular hypertension he would like his eyedrops transferred to our clinic pharmacy   Past Medical History:  Diagnosis  Date   Glaucoma     Past Surgical History:  Procedure Laterality Date   EYE SURGERY      Family History  Problem Relation Age of Onset   Hypertension Mother     Social History   Socioeconomic History   Marital status: Single    Spouse name: Not on file   Number of children: Not on file   Years of education: Not on file   Highest education level: Not on file  Occupational History   Not on file  Tobacco Use   Smoking status: Never   Smokeless tobacco: Never  Substance and Sexual Activity   Alcohol use: No   Drug use: No   Sexual activity: Yes  Other Topics Concern   Not on file  Social History Narrative   Not on file   Social Determinants of Health   Financial Resource Strain: Not on file  Food Insecurity: Not on file  Transportation Needs: Not on file  Physical Activity: Not on file  Stress: Not on file  Social Connections: Not on file  Intimate Partner Violence: Not on file    ROS Review of Systems  Constitutional: Negative.  Negative for chills, diaphoresis and fever.  HENT: Negative.  Negative for congestion, hearing loss, nosebleeds, sore throat and tinnitus.   Eyes:  Positive for redness and visual disturbance. Negative for photophobia and  pain.  Respiratory: Negative.  Negative for cough, shortness of breath, wheezing and stridor.   Cardiovascular: Negative.  Negative for chest pain, palpitations and leg swelling.  Gastrointestinal:  Negative for abdominal distention, abdominal pain, anal bleeding, blood in stool, constipation, diarrhea, nausea, rectal pain and vomiting.       Belching frequent  Endocrine: Negative for polydipsia.  Genitourinary: Negative.  Negative for dysuria, flank pain, frequency, hematuria and urgency.  Musculoskeletal: Negative.  Negative for back pain, myalgias and neck pain.  Skin: Negative.  Negative for rash.  Allergic/Immunologic: Negative for environmental allergies.  Neurological: Negative.  Negative for dizziness,  tremors, seizures, syncope, weakness and headaches.  Hematological: Negative.  Does not bruise/bleed easily.  Psychiatric/Behavioral: Negative.  Negative for dysphoric mood, sleep disturbance and suicidal ideas. The patient is not nervous/anxious.     Objective:   Today's Vitals: BP 108/66 (BP Location: Left Arm, Patient Position: Sitting, Cuff Size: Normal)   Pulse 67   Temp 98 F (36.7 C) (Oral)   Ht 5\' 9"  (1.753 m)   Wt 189 lb (85.7 kg)   SpO2 100%   BMI 27.91 kg/m   Physical Exam Vitals reviewed.  Constitutional:      Appearance: Normal appearance. He is well-developed. He is obese. He is not diaphoretic.  HENT:     Head: Normocephalic and atraumatic.     Nose: No nasal deformity, septal deviation, mucosal edema or rhinorrhea.     Right Sinus: No maxillary sinus tenderness or frontal sinus tenderness.     Left Sinus: No maxillary sinus tenderness or frontal sinus tenderness.     Mouth/Throat:     Pharynx: No oropharyngeal exudate.  Eyes:     General: No scleral icterus.    Conjunctiva/sclera: Conjunctivae normal.     Pupils: Pupils are equal, round, and reactive to light.  Neck:     Thyroid: No thyromegaly.     Vascular: No carotid bruit or JVD.     Trachea: Trachea normal. No tracheal tenderness or tracheal deviation.  Cardiovascular:     Rate and Rhythm: Normal rate and regular rhythm.     Chest Wall: PMI is not displaced.     Pulses: Normal pulses. No decreased pulses.     Heart sounds: Normal heart sounds, S1 normal and S2 normal. Heart sounds not distant. No murmur heard.    No systolic murmur is present.     No diastolic murmur is present.     No friction rub. No gallop. No S3 or S4 sounds.  Pulmonary:     Effort: Pulmonary effort is normal. No tachypnea, accessory muscle usage or respiratory distress.     Breath sounds: Normal breath sounds. No stridor. No decreased breath sounds, wheezing, rhonchi or rales.  Chest:     Chest wall: No tenderness.   Abdominal:     General: Bowel sounds are normal. There is no distension.     Palpations: Abdomen is soft. Abdomen is not rigid. There is no mass.     Tenderness: There is abdominal tenderness. There is no guarding or rebound.     Hernia: No hernia is present.     Comments: Patient had a large amount of gas with belching when I compressed his stomach and left lower quadrant tenderness without rebound  Musculoskeletal:        General: Normal range of motion.     Cervical back: Normal range of motion and neck supple. No edema, erythema or rigidity. No muscular tenderness. Normal range  of motion.  Lymphadenopathy:     Head:     Right side of head: No submental or submandibular adenopathy.     Left side of head: No submental or submandibular adenopathy.     Cervical: No cervical adenopathy.  Skin:    General: Skin is warm and dry.     Coloration: Skin is not pale.     Findings: No rash.     Nails: There is no clubbing.  Neurological:     Mental Status: He is alert and oriented to person, place, and time.     Sensory: No sensory deficit.  Psychiatric:        Mood and Affect: Mood normal.        Speech: Speech normal.        Behavior: Behavior normal.        Thought Content: Thought content normal.        Judgment: Judgment normal.     Assessment & Plan:   Problem List Items Addressed This Visit       Genitourinary   Stage 3b chronic kidney disease ([place])    Reassess renal function may need nephrology consult      Relevant Orders   BMP8+eGFR     Other   Glaucoma    Ocular hypertension will transfer drops to his eyes to our pharmacy      Relevant Medications   brimonidine (ALPHAGAN P) 0.15 % ophthalmic solution   dorzolamide (TRUSOPT) 2 % ophthalmic solution   ROCKLATAN 0.02-0.005 % SOLN   Eructation - Primary    Will make another referral to gastroenterology      Relevant Orders   Ambulatory referral to Gastroenterology   Mixed hyperlipidemia    Reassess lipids  continue statins      Relevant Medications   acetaZOLAMIDE ER (DIAMOX) 500 MG capsule   atorvastatin (LIPITOR) 10 MG tablet   Other Relevant Orders   Lipid panel   Other Visit Diagnoses     Colon cancer screening       Relevant Orders   Fecal occult blood, imunochemical   Need for immunization against influenza       Relevant Orders   Flu Vaccine QUAD 80mo+IM (Fluarix, Fluzone & Alfiuria Quad PF) (Completed)      Outpatient Encounter Medications as of 03/23/2022  Medication Sig   brimonidine (ALPHAGAN P) 0.15 % ophthalmic solution Place 1 drop into both eyes 3 (three) times daily.   [DISCONTINUED] acetaZOLAMIDE ER (DIAMOX) 500 MG capsule Take 500 mg by mouth 2 (two) times daily.   [DISCONTINUED] ALPHAGAN P 0.1 % SOLN Place 1 drop into the left eye 3 (three) times daily.   [DISCONTINUED] atorvastatin (LIPITOR) 10 MG tablet Take 1 tablet (10 mg total) by mouth daily.   [DISCONTINUED] dorzolamide (TRUSOPT) 2 % ophthalmic solution Place 1 drop into the left eye 2 (two) times daily.   [DISCONTINUED] dorzolamide-timolol (COSOPT) 22.3-6.8 MG/ML ophthalmic solution 1 drop 2 (two) times daily.   [DISCONTINUED] lidocaine (LIDODERM) 5 % Place 1 patch onto the skin daily. Remove & Discard patch within 12 hours or as directed by MD   [DISCONTINUED] pilocarpine (PILOCAR) 2 % ophthalmic solution Place 1 drop into both eyes 3 (three) times daily.   [DISCONTINUED] ROCKLATAN 0.02-0.005 % SOLN Place 1 drop into the left eye at bedtime.   acetaZOLAMIDE ER (DIAMOX) 500 MG capsule Take 1 capsule (500 mg total) by mouth 2 (two) times daily.   atorvastatin (LIPITOR) 10 MG tablet Take 1 tablet (  10 mg total) by mouth daily.   dorzolamide (TRUSOPT) 2 % ophthalmic solution Place 1 drop into the left eye 2 (two) times daily.   lidocaine (LIDODERM) 5 % Place 1 patch onto the skin daily. Remove & Discard patch within 12 hours or as directed by MD   ROCKLATAN 0.02-0.005 % SOLN Place 1 drop into the left eye at  bedtime.   No facility-administered encounter medications on file as of 03/23/2022.    Follow-up: 4 mo  Shan Levans, MD

## 2022-03-23 NOTE — Assessment & Plan Note (Signed)
Reassess renal function may need nephrology consult

## 2022-03-23 NOTE — Assessment & Plan Note (Signed)
Will make another referral to gastroenterology

## 2022-03-23 NOTE — Assessment & Plan Note (Signed)
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

## 2022-03-24 ENCOUNTER — Encounter: Payer: Self-pay | Admitting: Nurse Practitioner

## 2022-03-24 ENCOUNTER — Telehealth: Payer: Self-pay

## 2022-03-24 ENCOUNTER — Other Ambulatory Visit: Payer: Self-pay

## 2022-03-24 ENCOUNTER — Other Ambulatory Visit: Payer: Self-pay | Admitting: Critical Care Medicine

## 2022-03-24 LAB — BMP8+EGFR
BUN/Creatinine Ratio: 9 (ref 9–20)
BUN: 16 mg/dL (ref 6–24)
CO2: 19 mmol/L — ABNORMAL LOW (ref 20–29)
Calcium: 9.2 mg/dL (ref 8.7–10.2)
Chloride: 108 mmol/L — ABNORMAL HIGH (ref 96–106)
Creatinine, Ser: 1.85 mg/dL — ABNORMAL HIGH (ref 0.76–1.27)
Glucose: 91 mg/dL (ref 70–99)
Potassium: 4.3 mmol/L (ref 3.5–5.2)
Sodium: 139 mmol/L (ref 134–144)
eGFR: 45 mL/min/{1.73_m2} — ABNORMAL LOW (ref >59)

## 2022-03-24 LAB — LIPID PANEL
Chol/HDL Ratio: 3 ratio (ref 0.0–5.0)
Cholesterol, Total: 224 mg/dL — ABNORMAL HIGH (ref 100–199)
HDL: 74 mg/dL (ref >39)
LDL Chol Calc (NIH): 138 mg/dL — ABNORMAL HIGH (ref 0–99)
Triglycerides: 72 mg/dL (ref 0–149)
VLDL Cholesterol Cal: 12 mg/dL (ref 5–40)

## 2022-03-24 MED ORDER — ATORVASTATIN CALCIUM 20 MG PO TABS: 20.0000 mg | ORAL_TABLET | Freq: Every day | ORAL | 3 refills | Status: DC

## 2022-03-24 NOTE — Telephone Encounter (Signed)
Pt was called and is aware of results, DOB was confirmed.  ?

## 2022-03-24 NOTE — Progress Notes (Signed)
Cholesterol still too high  I sent higher dose to pharmacy of atorvastatin Kidney is better

## 2022-03-24 NOTE — Telephone Encounter (Signed)
-----   Message from Elsie Stain, MD sent at 03/24/2022 12:01 PM EST ----- Cholesterol still too high  I sent higher dose to pharmacy of atorvastatin Kidney is better

## 2022-03-25 ENCOUNTER — Other Ambulatory Visit: Payer: Self-pay

## 2022-03-30 ENCOUNTER — Other Ambulatory Visit: Payer: Self-pay

## 2022-04-13 ENCOUNTER — Other Ambulatory Visit: Payer: Self-pay | Admitting: Critical Care Medicine

## 2022-04-13 ENCOUNTER — Other Ambulatory Visit: Payer: Self-pay

## 2022-04-13 NOTE — Progress Notes (Signed)
Patient seen by Angus Seller, PharmD Candidate on 04/13/22 while they were picking up prescriptions at [place] [facility].   Blood pressure today was: 104/70, HR 59   Patient does not have an automated home blood pressure machine. Not on any hypertensive medications.   Medication review was performed. They are taking medications as prescribed.   The following barriers to adherence were noted:  - They do not have cost concerns.  - They do not have transportation concerns.  - They do not need assistance obtaining refills.  - They do not occasionally forget to take some of their prescribed medications.  - They do not feel like one/some of their medications make them feel poorly.  - They do not have questions or concerns about their medications.  - They do have follow up scheduled with their primary care provider/cardiologist.   The following interventions were completed:  - Medications were reviewed   The patient has follow up scheduled: 08/24/2022  PCP: Dr. Amalia Greenhouse, PharmD Candidate  [employer]   Joseph Art, [place].D. PGY-2 Ambulatory Care Pharmacy Resident

## 2022-04-13 NOTE — Telephone Encounter (Signed)
Dc'd 03/24/22 Dr Joya Gaskins  Requested Prescriptions  Refused Prescriptions Disp Refills   atorvastatin (LIPITOR) 10 MG tablet 90 tablet 3    Sig: Take 1 tablet (10 mg total) by mouth daily.     Cardiovascular:  Antilipid - Statins Failed - 04/13/2022 10:51 AM      Failed - Lipid Panel in normal range within the last 12 months    Cholesterol, Total  Date Value Ref Range Status  03/23/2022 224 (H) 100 - 199 mg/dL Final   LDL Chol Calc (NIH)  Date Value Ref Range Status  03/23/2022 138 (H) 0 - 99 mg/dL Final   HDL  Date Value Ref Range Status  03/23/2022 74 >39 mg/dL Final   Triglycerides  Date Value Ref Range Status  03/23/2022 72 0 - 149 mg/dL Final         Passed - Patient is not pregnant      Passed - Valid encounter within last 12 months    Recent Outpatient Visits           3 weeks ago [place], MD   8 months ago [place] Elsie Stain, MD   1 year ago [place], MD       Future Appointments             In 4 months Joya Gaskins Burnett Harry, MD [place]

## 2022-04-18 ENCOUNTER — Other Ambulatory Visit: Payer: Self-pay

## 2022-04-19 ENCOUNTER — Other Ambulatory Visit: Payer: Self-pay

## 2022-04-27 ENCOUNTER — Ambulatory Visit: Payer: Self-pay | Admitting: Nurse Practitioner

## 2022-04-27 ENCOUNTER — Other Ambulatory Visit: Payer: Self-pay

## 2022-04-27 MED ORDER — DORZOLAMIDE HCL 2 % OP SOLN
1.0000 [drp] | Freq: Two times a day (BID) | OPHTHALMIC | 4 refills | Status: DC
  Filled 2022-04-27: qty 10, 90d supply, fill #0

## 2022-04-27 MED ORDER — DORZOLAMIDE HCL-TIMOLOL MAL 2-0.5 % OP SOLN
1.0000 [drp] | Freq: Two times a day (BID) | OPHTHALMIC | 3 refills | Status: DC
  Filled 2022-04-27: qty 10, 50d supply, fill #0
  Filled 2022-06-16: qty 10, 50d supply, fill #1

## 2022-04-27 MED ORDER — BRIMONIDINE TARTRATE 0.1 % OP SOLN
1.0000 [drp] | Freq: Three times a day (TID) | OPHTHALMIC | 3 refills | Status: DC
  Filled 2022-04-27: qty 10, 33d supply, fill #0
  Filled 2022-06-03: qty 10, 33d supply, fill #1
  Filled 2022-07-14: qty 10, 33d supply, fill #2

## 2022-04-27 MED ORDER — ROCKLATAN 0.02-0.005 % OP SOLN
1.0000 [drp] | Freq: Every evening | OPHTHALMIC | 4 refills | Status: DC
  Filled 2022-04-27: qty 2.5, 50d supply, fill #0

## 2022-04-27 MED ORDER — PILOCARPINE HCL 2 % OP SOLN
1.0000 [drp] | Freq: Three times a day (TID) | OPHTHALMIC | 0 refills | Status: DC
  Filled 2022-04-27: qty 15, 50d supply, fill #0

## 2022-04-28 ENCOUNTER — Other Ambulatory Visit: Payer: Self-pay

## 2022-06-03 ENCOUNTER — Other Ambulatory Visit: Payer: Self-pay | Admitting: Critical Care Medicine

## 2022-06-03 ENCOUNTER — Other Ambulatory Visit: Payer: Self-pay

## 2022-06-03 MED ORDER — ATORVASTATIN CALCIUM 20 MG PO TABS
20.0000 mg | ORAL_TABLET | Freq: Every day | ORAL | 3 refills | Status: DC
  Filled 2022-06-03: qty 90, 90d supply, fill #0
  Filled 2022-08-29: qty 30, 30d supply, fill #1
  Filled 2022-10-03: qty 30, 30d supply, fill #2
  Filled 2022-11-15: qty 30, 30d supply, fill #3
  Filled 2023-01-06: qty 30, 30d supply, fill #4

## 2022-06-06 ENCOUNTER — Other Ambulatory Visit: Payer: Self-pay

## 2022-06-07 ENCOUNTER — Other Ambulatory Visit: Payer: Self-pay

## 2022-06-15 ENCOUNTER — Ambulatory Visit (INDEPENDENT_AMBULATORY_CARE_PROVIDER_SITE_OTHER): Payer: Self-pay | Admitting: Nurse Practitioner

## 2022-06-15 ENCOUNTER — Encounter: Payer: Self-pay | Admitting: Nurse Practitioner

## 2022-06-15 DIAGNOSIS — R142 Eructation: Secondary | ICD-10-CM

## 2022-06-15 DIAGNOSIS — Z1211 Encounter for screening for malignant neoplasm of colon: Secondary | ICD-10-CM

## 2022-06-15 NOTE — Progress Notes (Signed)
Assessment / Plan   Primary GI: New - Marsa Aris, MD  47 y.o. yo male from [place] a past medical history of glaucoma and chronic belching.   Chronic belching. Favor supragastric over gastric belching but still need consider hiatal hernia or other pathology -Schedule for EGD. The risks and benefits of EGD with possible biopsies were discussed with the patient who agrees to proceed.  -Trial of hyoscyamine as needed while awaiting EGD but may not be helpful if this is in fact supragastric belching  Glaucoma. Followed by Ophthalmology.   Colon cancer screening.  -Schedule for a colonoscopy to be done at time of EGD. The risks and benefits of colonoscopy with possible polypectomy / biopsies were discussed and the patient agrees to proceed.    History of Present Illness   Chief Complaint:  belching   Patrick King is a 47 y.o. year old male with a past medical history of X. See PMH / PSH for additional history.   Abdulah belches multiple, multiple times a day. This started about 5 years ago.  The belching is spontaneous, not meal related  He has no heartburn nor does he regurgitate liquid or food with the belches but he does occasionally taste food eaten a few minutes prior.  Doesn't drink much soda. Doesn't use a straw . He stopped chewing gum. None of these things have made any difference.    He has no chest pain.  No dysphagia.  No nausea/vomiting . He tried Omeprazole for a month without any improvement.  He cannot relate the belching to stress or anxiety. The excessive belching is embarrassing and affecting his quality of life.    His weight is overall stable other than losing a few pounds during Ramadan.   Unrelated, he has no [facility] of colon cancer. No blood in stool. He has never had a screening colonoscopy. CBC 11 months ago was normal.    Previous Labs / Imaging::    Latest Ref Rng & Units 08/11/2021   11:00 AM  CBC  WBC 3.4 - 10.8 x10E3/uL 4.1   Hemoglobin  13.0 - 17.7 g/dL 47.4   Hematocrit 25.9 - 51.0 % 42.6   Platelets 150 - 450 x10E3/uL 258     No results found for: "LIPASE"    Latest Ref Rng & Units 03/23/2022   10:52 AM 08/11/2021   11:00 AM  CMP  Glucose 70 - 99 mg/dL 91  86   BUN 6 - 24 mg/dL 16  19   Creatinine 5.63 - 1.27 mg/dL 8.75  6.43   Sodium 329 - 144 mmol/L 139  137   Potassium 3.5 - 5.2 mmol/L 4.3  4.2   Chloride 96 - 106 mmol/L 108  104   CO2 20 - 29 mmol/L 19  18   Calcium 8.7 - 10.2 mg/dL 9.2  9.5   Total Protein 6.0 - 8.5 g/dL  7.9   Total Bilirubin 0.0 - 1.2 mg/dL  0.3   Alkaline Phos 44 - 121 IU/L  84   AST 0 - 40 IU/L  24   ALT 0 - 44 IU/L  13     Past Medical History:  Diagnosis Date   Glaucoma    Past Surgical History:  Procedure Laterality Date   EYE SURGERY     Family History  Problem Relation Age of Onset   Hypertension Mother    Colon cancer Neg Hx    Stomach cancer Neg Hx  Esophageal cancer Neg Hx    Social History   Tobacco Use   Smoking status: Never   Smokeless tobacco: Never  Vaping Use   Vaping Use: Never used  Substance Use Topics   Alcohol use: No   Drug use: No   Current Outpatient Medications  Medication Sig Dispense Refill   acetaZOLAMIDE ER (DIAMOX) 500 MG capsule Take 1 capsule (500 mg total) by mouth 2 (two) times daily. 180 capsule 1   atorvastatin (LIPITOR) 20 MG tablet Take 1 tablet (20 mg total) by mouth daily. 90 tablet 3   brimonidine (ALPHAGAN P) 0.1 % SOLN Place 1 drop into both eyes 3 (three) times daily as directed. 10 mL 3   brimonidine (ALPHAGAN P) 0.15 % ophthalmic solution Place 1 drop into both eyes 3 (three) times daily. 5 mL 12   dorzolamide (TRUSOPT) 2 % ophthalmic solution Place 1 drop into the left eye 2 (two) times daily. 10 mL 4   dorzolamide (TRUSOPT) 2 % ophthalmic solution Place 1 drop into the left eye 2 (two) times daily as directed. 10 mL 4   dorzolamide-timolol (COSOPT) 2-0.5 % ophthalmic solution Place 1 drop into both eyes 2 (two) times  daily as directed. 10 mL 3   lidocaine (LIDODERM) 5 % Place 1 patch onto the skin daily. Remove & Discard patch within 12 hours or as directed by MD 90 patch 0   Netarsudil-Latanoprost (ROCKLATAN) 0.02-0.005 % SOLN Place 1 drop into the left eye Nightly as directed. 2.5 mL 4   pilocarpine (PILOCAR) 2 % ophthalmic solution Place 1 drop into both eyes 3 (three) times daily. 15 mL 0   ROCKLATAN 0.02-0.005 % SOLN Place 1 drop into the left eye at bedtime. 2.5 mL 4   No current facility-administered medications for this visit.   No Known Allergies   Review of Systems: All systems reviewed and negative except where noted in HPI.   Wt Readings from Last 3 Encounters:  06/15/22 189 lb (85.7 kg)  03/23/22 189 lb (85.7 kg)  08/11/21 188 lb 6.4 oz (85.5 kg)    Physical Exam:  BP 112/76   Pulse (!) 51   Ht 5\' 9"  (1.753 m)   Wt 189 lb (85.7 kg)   BMI 27.91 kg/m  Constitutional:  Pleasant, generally well appearing male in no acute distress. Appears calm. Belching during our visit Psychiatric:  Normal mood and affect. Behavior is normal. EENT: Redness of both eyes.  Cardiovascular: Normal rate, regular rhythm.  Pulmonary/chest: Effort normal and breath sounds normal. No wheezing, rales or rhonchi. Abdominal: Soft, nondistended, nontender. Bowel sounds active throughout. There are no masses palpable. No hepatomegaly. Neurological: Alert and oriented to person place and time. Skin: Skin is warm and dry. No rashes noted.  Willette Cluster, NP  06/15/2022, 11:34 AM  Cc:  Referring Provider Storm Frisk, MD

## 2022-06-15 NOTE — Patient Instructions (Signed)
_______________________________________________________  If your blood pressure at your visit was 140/90 or greater, please contact your primary care physician to follow up on this. _______________________________________________________  If you are age 46 or younger, your body mass index should be between 19-25. Your Body mass index is 27.91 kg/m. If this is out of the aformentioned range listed, please consider follow up with your Primary Care Provider.  ________________________________________________________  The [place] [facility] providers would like to encourage you to use [facility] to communicate with providers for non-urgent requests or questions.  Due to long hold times on the telephone, sending your provider a message by [facility] may be a faster and more efficient way to get a response.  Please allow 48 business hours for a response.  Please remember that this is for non-urgent requests.  _______________________________________________________  Bonita Quin have been scheduled for an endoscopy and colonoscopy. Please follow the written instructions given to you at your visit today. Please pick up your prep supplies at the pharmacy within the next 1-3 days. If you use inhalers (even only as needed), please bring them with you on the day of your procedure.  You can use Gas X as needed.  Due to recent changes in healthcare laws, you may see the results of your imaging and laboratory studies on MyChart before your provider has had a chance to review them.  We understand that in some cases there may be results that are confusing or concerning to you. Not all laboratory results come back in the same time frame and the provider may be waiting for multiple results in order to interpret others.  Please give [place] 48 hours in order for your provider to thoroughly review all the results before contacting the office for clarification of your results.   Thank you for entrusting me with your care and choosing [facility]  Willette Cluster, NP

## 2022-06-16 ENCOUNTER — Other Ambulatory Visit: Payer: Self-pay

## 2022-06-17 ENCOUNTER — Other Ambulatory Visit: Payer: Self-pay

## 2022-06-21 ENCOUNTER — Other Ambulatory Visit: Payer: Self-pay

## 2022-06-21 MED ORDER — PLENVU 140 G PO SOLR
1.0000 | ORAL | 0 refills | Status: DC
  Filled 2022-06-21: qty 3, 1d supply, fill #0

## 2022-06-21 NOTE — Addendum Note (Signed)
Addended by: Lamona Curl on: 06/21/2022 03:30 PM   Modules accepted: Orders

## 2022-06-27 ENCOUNTER — Other Ambulatory Visit: Payer: Self-pay

## 2022-07-01 ENCOUNTER — Other Ambulatory Visit: Payer: Self-pay | Admitting: Critical Care Medicine

## 2022-07-03 LAB — FECAL OCCULT BLOOD, IMMUNOCHEMICAL: Fecal Occult Bld: NEGATIVE

## 2022-07-04 ENCOUNTER — Telehealth: Payer: Self-pay

## 2022-07-04 NOTE — Progress Notes (Signed)
Let pt know colon cancer screen negative recheck one year

## 2022-07-04 NOTE — Telephone Encounter (Signed)
Pt was called and vm was left, Information has been sent to nurse pool.   

## 2022-07-04 NOTE — Telephone Encounter (Signed)
-----   Message from Storm Frisk, MD sent at 07/04/2022  6:01 AM EDT ----- Let pt know colon cancer screen negative recheck one year

## 2022-07-14 ENCOUNTER — Other Ambulatory Visit: Payer: Self-pay

## 2022-07-14 MED ORDER — ROCKLATAN 0.02-0.005 % OP SOLN
1.0000 [drp] | Freq: Every day | OPHTHALMIC | 0 refills | Status: DC
  Filled 2022-07-14: qty 2.5, 25d supply, fill #0

## 2022-07-19 ENCOUNTER — Other Ambulatory Visit: Payer: Self-pay

## 2022-07-19 ENCOUNTER — Other Ambulatory Visit (HOSPITAL_BASED_OUTPATIENT_CLINIC_OR_DEPARTMENT_OTHER): Payer: Self-pay

## 2022-07-20 ENCOUNTER — Other Ambulatory Visit: Payer: Self-pay

## 2022-08-04 ENCOUNTER — Other Ambulatory Visit: Payer: Self-pay

## 2022-08-04 MED ORDER — ROCKLATAN 0.02-0.005 % OP SOLN
1.0000 [drp] | Freq: Every evening | OPHTHALMIC | 3 refills | Status: DC
  Filled 2022-08-11: qty 2.5, 25d supply, fill #0
  Filled 2022-09-12: qty 2.5, 25d supply, fill #1
  Filled 2022-10-03: qty 2.5, 25d supply, fill #2

## 2022-08-04 MED ORDER — BRIMONIDINE TARTRATE 0.1 % OP SOLN
1.0000 [drp] | Freq: Three times a day (TID) | OPHTHALMIC | 3 refills | Status: DC
  Filled 2022-08-04: qty 10, 33d supply, fill #0
  Filled 2022-09-12 (×2): qty 10, 33d supply, fill #1

## 2022-08-04 MED ORDER — ROCKLATAN 0.02-0.005 % OP SOLN
1.0000 [drp] | Freq: Every evening | OPHTHALMIC | 3 refills | Status: DC
  Filled 2022-10-24: qty 2.5, 25d supply, fill #0

## 2022-08-04 MED ORDER — PILOCARPINE HCL 2 % OP SOLN
1.0000 [drp] | Freq: Three times a day (TID) | OPHTHALMIC | 0 refills | Status: DC | PRN
  Filled 2022-08-04: qty 15, 50d supply, fill #0

## 2022-08-04 MED ORDER — DORZOLAMIDE HCL-TIMOLOL MAL 2-0.5 % OP SOLN
1.0000 [drp] | Freq: Two times a day (BID) | OPHTHALMIC | 3 refills | Status: DC
  Filled 2022-08-04: qty 10, 50d supply, fill #0
  Filled 2022-09-12: qty 10, 50d supply, fill #1
  Filled 2022-10-24: qty 10, 50d supply, fill #2

## 2022-08-04 MED ORDER — ACETAZOLAMIDE ER 500 MG PO CP12
500.0000 mg | ORAL_CAPSULE | Freq: Two times a day (BID) | ORAL | 2 refills | Status: DC
  Filled 2022-10-24: qty 60, 30d supply, fill #0

## 2022-08-05 ENCOUNTER — Other Ambulatory Visit: Payer: Self-pay

## 2022-08-10 ENCOUNTER — Other Ambulatory Visit: Payer: Self-pay

## 2022-08-11 ENCOUNTER — Other Ambulatory Visit: Payer: Self-pay

## 2022-08-16 ENCOUNTER — Other Ambulatory Visit: Payer: Self-pay

## 2022-08-23 ENCOUNTER — Telehealth: Payer: Self-pay | Admitting: Gastroenterology

## 2022-08-23 ENCOUNTER — Encounter: Payer: Self-pay | Admitting: Gastroenterology

## 2022-08-23 ENCOUNTER — Ambulatory Visit: Payer: Self-pay | Admitting: Critical Care Medicine

## 2022-08-23 NOTE — Telephone Encounter (Signed)
ok 

## 2022-08-23 NOTE — Telephone Encounter (Signed)
Hi Dr Lavon Paganini,    I called patient regarding his appointment, patient states he did colonoscopy test with his PCP and tried cancelling today's appointment through test message when he received his reminder message. I will no show patient for today's appointment.    Thank you

## 2022-08-24 ENCOUNTER — Other Ambulatory Visit: Payer: Self-pay

## 2022-08-24 ENCOUNTER — Ambulatory Visit: Payer: Self-pay | Attending: Critical Care Medicine | Admitting: Critical Care Medicine

## 2022-08-24 MED ORDER — BRIMONIDINE TARTRATE 0.1 % OP SOLN
1.0000 [drp] | Freq: Three times a day (TID) | OPHTHALMIC | 3 refills | Status: DC
  Filled 2022-08-24: qty 10, 33d supply, fill #0
  Filled 2022-10-24: qty 10, 33d supply, fill #1

## 2022-08-24 NOTE — Progress Notes (Deleted)
New Patient Office Visit  Subjective:  Patient ID: Patrick King, male    DOB: 01-Jan-1976  Age: 47 y.o. MRN: [id number]  CC:  No chief complaint on file.   HPI Patrick King presents for primary care .   01/05/2021 Patient is from [place] but has been in this country for many years.  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  Patient does carry diagnosis of glaucoma and is on 2 different eyedrops and oral Diamox and followed closely by general ophthalmology.  Patient states his vision is at baseline.  On arrival blood pressure is good at 128/80.  Patient does complain of chronic belching but denies any other heartburn or other related GI symptoms.  He has not been treated for this in the past.  Patient has no other complaints at this time.  There is a history of hypertension that runs in his mother side of the family  6/28 Patient seen in return visit complaining of continued eructation that is persistent.  He is following a lactose-free diet he has tried eliminate most of the common precipitants of excess upper intestinal gas.  He has no lower intestinal gas.  He has had this problem since 2007.  He has not seen gastroenterology as of yet.  Reflux medications are of no benefit.  Patient had not gotten his health screening labs at the last visit in November will need to be ordered at this visit  03/23/22 this patient is seen in follow-up from a visit in June of last year.  He is still having belching and burping.  We referred him to gastroenterology but he did not answer his phone and the appointment that was never made. The patient also failed to do his colon cancer screening kit will now get this through gastroenterology Patient is followed by ophthalmology for ocular hypertension he would like his eyedrops transferred to our clinic pharmacy  08/24/22    Past Medical History:  Diagnosis Date   Glaucoma     Past Surgical History:  Procedure Laterality  Date   EYE SURGERY      Family History  Problem Relation Age of Onset   Hypertension Mother    Colon cancer Neg Hx    Stomach cancer Neg Hx    Esophageal cancer Neg Hx     Social History   Socioeconomic History   Marital status: Single    Spouse name: Not on file   Number of children: 3   Years of education: Not on file   Highest education level: Not on file  Occupational History   Occupation: unemployed  Tobacco Use   Smoking status: Never   Smokeless tobacco: Never  Vaping Use   Vaping Use: Never used  Substance and Sexual Activity   Alcohol use: No   Drug use: No   Sexual activity: Yes  Other Topics Concern   Not on file  Social History Narrative   Not on file   Social Determinants of Health   Financial Resource Strain: Not on file  Food Insecurity: Not on file  Transportation Needs: Not on file  Physical Activity: Not on file  Stress: Not on file  Social Connections: Not on file  Intimate Partner Violence: Not on file    ROS Review of Systems  Constitutional: Negative.  Negative for chills, diaphoresis and fever.  HENT: Negative.  Negative for congestion, hearing loss, nosebleeds, sore throat and tinnitus.   Eyes:  Positive for redness and visual disturbance. Negative for photophobia and pain.  Respiratory: Negative.  Negative for cough, shortness of breath, wheezing and stridor.   Cardiovascular: Negative.  Negative for chest pain, palpitations and leg swelling.  Gastrointestinal:  Negative for abdominal distention, abdominal pain, anal bleeding, blood in stool, constipation, diarrhea, nausea, rectal pain and vomiting.       Belching frequent  Endocrine: Negative for polydipsia.  Genitourinary: Negative.  Negative for dysuria, flank pain, frequency, hematuria and urgency.  Musculoskeletal: Negative.  Negative for back pain, myalgias and neck pain.  Skin: Negative.  Negative for rash.  Allergic/Immunologic: Negative for environmental allergies.   Neurological: Negative.  Negative for dizziness, tremors, seizures, syncope, weakness and headaches.  Hematological: Negative.  Does not bruise/bleed easily.  Psychiatric/Behavioral: Negative.  Negative for dysphoric mood, sleep disturbance and suicidal ideas. The patient is not nervous/anxious.     Objective:   Today's Vitals: There were no vitals taken for this visit.  Physical Exam Vitals reviewed.  Constitutional:      Appearance: Normal appearance. He is well-developed. He is obese. He is not diaphoretic.  HENT:     Head: Normocephalic and atraumatic.     Nose: No nasal deformity, septal deviation, mucosal edema or rhinorrhea.     Right Sinus: No maxillary sinus tenderness or frontal sinus tenderness.     Left Sinus: No maxillary sinus tenderness or frontal sinus tenderness.     Mouth/Throat:     Pharynx: No oropharyngeal exudate.  Eyes:     General: No scleral icterus.    Conjunctiva/sclera: Conjunctivae normal.     Pupils: Pupils are equal, round, and reactive to light.  Neck:     Thyroid: No thyromegaly.     Vascular: No carotid bruit or JVD.     Trachea: Trachea normal. No tracheal tenderness or tracheal deviation.  Cardiovascular:     Rate and Rhythm: Normal rate and regular rhythm.     Chest Wall: PMI is not displaced.     Pulses: Normal pulses. No decreased pulses.     Heart sounds: Normal heart sounds, S1 normal and S2 normal. Heart sounds not distant. No murmur heard.    No systolic murmur is present.     No diastolic murmur is present.     No friction rub. No gallop. No S3 or S4 sounds.  Pulmonary:     Effort: Pulmonary effort is normal. No tachypnea, accessory muscle usage or respiratory distress.     Breath sounds: Normal breath sounds. No stridor. No decreased breath sounds, wheezing, rhonchi or rales.  Chest:     Chest wall: No tenderness.  Abdominal:     General: Bowel sounds are normal. There is no distension.     Palpations: Abdomen is soft. Abdomen  is not rigid. There is no mass.     Tenderness: There is abdominal tenderness. There is no guarding or rebound.     Hernia: No hernia is present.     Comments: Patient had a large amount of gas with belching when I compressed his stomach and left lower quadrant tenderness without rebound  Musculoskeletal:        General: Normal range of motion.     Cervical back: Normal range of motion and neck supple. No edema, erythema or rigidity. No muscular tenderness. Normal range of motion.  Lymphadenopathy:     Head:     Right side of head: No submental or submandibular adenopathy.     Left side of head:  No submental or submandibular adenopathy.     Cervical: No cervical adenopathy.  Skin:    General: Skin is warm and dry.     Coloration: Skin is not pale.     Findings: No rash.     Nails: There is no clubbing.  Neurological:     Mental Status: He is alert and oriented to person, place, and time.     Sensory: No sensory deficit.  Psychiatric:        Mood and Affect: Mood normal.        Speech: Speech normal.        Behavior: Behavior normal.        Thought Content: Thought content normal.        Judgment: Judgment normal.     Assessment & Plan:   Problem List Items Addressed This Visit   None Outpatient Encounter Medications as of 08/24/2022  Medication Sig   acetaZOLAMIDE ER (DIAMOX) 500 MG capsule Take 1 capsule (500 mg total) by mouth 2 (two) times daily.   acetaZOLAMIDE ER (DIAMOX) 500 MG capsule Take 1 capsule (500 mg total) by mouth 2 (two) times daily.   atorvastatin (LIPITOR) 20 MG tablet Take 1 tablet (20 mg total) by mouth daily.   brimonidine (ALPHAGAN P) 0.1 % SOLN Place 1 drop into both eyes 3 (three) times daily.   dorzolamide-timolol (COSOPT) 2-0.5 % ophthalmic solution Place 1 drop into both eyes 2 (two) times daily.   lidocaine (LIDODERM) 5 % Place 1 patch onto the skin daily. Remove & Discard patch within 12 hours or as directed by MD   Netarsudil-Latanoprost  (ROCKLATAN) 0.02-0.005 % SOLN Place 1 drop into both eyes Nightly.   Netarsudil-Latanoprost (ROCKLATAN) 0.02-0.005 % SOLN Instill 1 drop into both eyes every night as directed   PEG-KCl-NaCl-NaSulf-Na Asc-C (PLENVU) 140 g SOLR Take 1 kit by mouth as directed.   pilocarpine (PILOCAR) 2 % ophthalmic solution Place 1 drop into both eyes 3 (three) times daily.   pilocarpine (PILOCAR) 2 % ophthalmic solution Place 1 drop into both eyes 3 (three) times daily as needed.   No facility-administered encounter medications on file as of 08/24/2022.    Follow-up: 4 mo  Shan Levans, MD

## 2022-08-25 ENCOUNTER — Other Ambulatory Visit: Payer: Self-pay

## 2022-08-29 ENCOUNTER — Other Ambulatory Visit: Payer: Self-pay

## 2022-08-29 ENCOUNTER — Other Ambulatory Visit: Payer: Self-pay | Admitting: Critical Care Medicine

## 2022-08-30 ENCOUNTER — Other Ambulatory Visit: Payer: Self-pay

## 2022-08-30 NOTE — Telephone Encounter (Signed)
Requested medication (s) are due for refill today: Yes  Requested medication (s) are on the active medication list: Yes  Last refill:  03/23/22  Future visit scheduled: No  Notes to clinic:  Unable to refill per protocol due to failed labs, no updated results.      Requested Prescriptions  Pending Prescriptions Disp Refills   lidocaine (LIDODERM) 5 % 90 patch 0    Sig: Place 1 patch onto the skin daily. Remove & Discard patch within 12 hours or as directed by MD     Analgesics:  Topicals Failed - 08/29/2022  1:20 PM      Failed - Manual Review: Labs are only required if the patient has taken medication for more than 8 weeks.      Failed - PLT in normal range and within 360 days    Platelets  Date Value Ref Range Status  08/11/2021 258 150 - 450 x10E3/uL Final         Failed - HGB in normal range and within 360 days    Hemoglobin  Date Value Ref Range Status  08/11/2021 13.9 13.0 - 17.7 g/dL Final         Failed - HCT in normal range and within 360 days    Hematocrit  Date Value Ref Range Status  08/11/2021 42.6 37.5 - 51.0 % Final         Failed - Cr in normal range and within 360 days    Creatinine, Ser  Date Value Ref Range Status  03/23/2022 1.85 (H) 0.76 - 1.27 mg/dL Final         Passed - eGFR is 30 or above and within 360 days    eGFR  Date Value Ref Range Status  03/23/2022 45 (L) >59 mL/min/1.73 Final         Passed - Patient is not pregnant      Passed - Valid encounter within last 12 months    Recent Outpatient Visits           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

## 2022-09-01 ENCOUNTER — Other Ambulatory Visit: Payer: Self-pay

## 2022-09-12 ENCOUNTER — Other Ambulatory Visit: Payer: Self-pay | Admitting: Critical Care Medicine

## 2022-09-12 ENCOUNTER — Other Ambulatory Visit: Payer: Self-pay

## 2022-09-13 ENCOUNTER — Other Ambulatory Visit: Payer: Self-pay

## 2022-09-13 NOTE — Telephone Encounter (Signed)
Requested medications are due for refill today.  yes  Requested medications are on the active medications list.  yes  Last refill. 03/23/2022 #90 0 rf  Future visit scheduled.   no  Notes to clinic.  Rx was refused 2 weeks ago by practice.    Requested Prescriptions  Pending Prescriptions Disp Refills   lidocaine (LIDODERM) 5 % 90 patch 0    Sig: Place 1 patch onto the skin daily. Remove & Discard patch within 12 hours or as directed by MD     Analgesics:  Topicals Failed - 09/12/2022 11:22 AM      Failed - Manual Review: Labs are only required if the patient has taken medication for more than 8 weeks.      Failed - PLT in normal range and within 360 days    Platelets  Date Value Ref Range Status  08/11/2021 258 150 - 450 x10E3/uL Final         Failed - HGB in normal range and within 360 days    Hemoglobin  Date Value Ref Range Status  08/11/2021 13.9 13.0 - 17.7 g/dL Final         Failed - HCT in normal range and within 360 days    Hematocrit  Date Value Ref Range Status  08/11/2021 42.6 37.5 - 51.0 % Final         Failed - Cr in normal range and within 360 days    Creatinine, Ser  Date Value Ref Range Status  03/23/2022 1.85 (H) 0.76 - 1.27 mg/dL Final         Passed - eGFR is 30 or above and within 360 days    eGFR  Date Value Ref Range Status  03/23/2022 45 (L) >59 mL/min/1.73 Final         Passed - Patient is not pregnant      Passed - Valid encounter within last 12 months    Recent Outpatient Visits           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

## 2022-09-20 ENCOUNTER — Other Ambulatory Visit: Payer: Self-pay

## 2022-09-22 DIAGNOSIS — H401133 Primary open-angle glaucoma, bilateral, severe stage: Secondary | ICD-10-CM | POA: Insufficient documentation

## 2022-10-03 ENCOUNTER — Other Ambulatory Visit: Payer: Self-pay

## 2022-10-03 MED ORDER — PILOCARPINE HCL 2 % OP SOLN
1.0000 [drp] | Freq: Three times a day (TID) | OPHTHALMIC | 1 refills | Status: DC | PRN
  Filled 2022-10-03: qty 15, 50d supply, fill #0
  Filled 2022-11-15: qty 15, 50d supply, fill #1

## 2022-10-04 ENCOUNTER — Other Ambulatory Visit: Payer: Self-pay

## 2022-10-05 ENCOUNTER — Other Ambulatory Visit: Payer: Self-pay

## 2022-10-24 ENCOUNTER — Other Ambulatory Visit: Payer: Self-pay

## 2022-10-25 ENCOUNTER — Other Ambulatory Visit: Payer: Self-pay

## 2022-10-26 ENCOUNTER — Other Ambulatory Visit: Payer: Self-pay

## 2022-10-27 ENCOUNTER — Other Ambulatory Visit: Payer: Self-pay

## 2022-10-27 MED ORDER — BRIMONIDINE TARTRATE 0.1 % OP SOLN: 1.0000 [drp] | Freq: Three times a day (TID) | OPHTHALMIC | 3 refills | Status: DC

## 2022-11-15 ENCOUNTER — Other Ambulatory Visit: Payer: Self-pay

## 2022-11-16 ENCOUNTER — Other Ambulatory Visit: Payer: Self-pay

## 2022-11-18 ENCOUNTER — Other Ambulatory Visit: Payer: Self-pay

## 2022-11-24 ENCOUNTER — Other Ambulatory Visit: Payer: Self-pay

## 2022-11-24 MED ORDER — PREDNISOLONE ACETATE 1 % OP SUSP
1.0000 [drp] | Freq: Four times a day (QID) | OPHTHALMIC | 1 refills | Status: DC
  Filled 2022-11-24: qty 5, 25d supply, fill #0

## 2022-11-28 ENCOUNTER — Other Ambulatory Visit: Payer: Self-pay

## 2022-11-28 MED ORDER — PREDNISOLONE ACETATE 1 % OP SUSP: 1.0000 [drp] | Freq: Four times a day (QID) | OPHTHALMIC | 1 refills | Status: DC

## 2022-11-28 MED ORDER — BRIMONIDINE TARTRATE 0.1 % OP SOLN
1.0000 [drp] | Freq: Three times a day (TID) | OPHTHALMIC | 12 refills | Status: AC
  Filled 2022-11-28: qty 10, 67d supply, fill #0
  Filled 2023-02-20: qty 10, 67d supply, fill #1
  Filled 2023-04-04: qty 10, 67d supply, fill #2
  Filled 2023-04-25: qty 5, 33d supply, fill #2
  Filled 2023-05-22: qty 10, 67d supply, fill #3
  Filled 2023-07-06: qty 10, 67d supply, fill #4
  Filled 2023-08-14: qty 5, 34d supply, fill #5
  Filled 2023-08-14: qty 10, 67d supply, fill #5

## 2022-11-28 MED ORDER — DORZOLAMIDE HCL-TIMOLOL MAL 2-0.5 % OP SOLN: 1.0000 [drp] | Freq: Two times a day (BID) | OPHTHALMIC | 11 refills | Status: DC

## 2022-11-28 MED ORDER — PREDNISOLONE ACETATE 1 % OP SUSP
1.0000 [drp] | Freq: Four times a day (QID) | OPHTHALMIC | 1 refills | Status: DC
  Filled 2022-11-28: qty 10, 50d supply, fill #0

## 2022-11-28 MED ORDER — POLYMYXIN B-TRIMETHOPRIM 10000-0.1 UNIT/ML-% OP SOLN
1.0000 [drp] | Freq: Four times a day (QID) | OPHTHALMIC | 0 refills | Status: DC
  Filled 2022-11-28: qty 10, 50d supply, fill #0

## 2022-11-28 MED ORDER — PILOCARPINE HCL 2 % OP SOLN: 1.0000 [drp] | Freq: Three times a day (TID) | OPHTHALMIC | 11 refills | Status: DC | PRN

## 2022-11-29 ENCOUNTER — Other Ambulatory Visit: Payer: Self-pay

## 2022-11-30 ENCOUNTER — Other Ambulatory Visit: Payer: Self-pay

## 2022-12-07 ENCOUNTER — Other Ambulatory Visit: Payer: Self-pay

## 2022-12-13 ENCOUNTER — Other Ambulatory Visit: Payer: Self-pay

## 2022-12-13 MED ORDER — DIFLUPREDNATE 0.05 % OP EMUL
1.0000 [drp] | Freq: Four times a day (QID) | OPHTHALMIC | 3 refills | Status: DC
  Filled 2022-12-13 (×2): qty 5, 25d supply, fill #0
  Filled 2022-12-27: qty 5, 25d supply, fill #1
  Filled 2023-01-17: qty 5, 25d supply, fill #2

## 2022-12-14 ENCOUNTER — Other Ambulatory Visit: Payer: Self-pay

## 2022-12-27 ENCOUNTER — Other Ambulatory Visit: Payer: Self-pay

## 2022-12-30 ENCOUNTER — Other Ambulatory Visit: Payer: Self-pay

## 2023-01-06 ENCOUNTER — Other Ambulatory Visit: Payer: Self-pay

## 2023-01-09 ENCOUNTER — Other Ambulatory Visit: Payer: Self-pay

## 2023-01-17 ENCOUNTER — Other Ambulatory Visit: Payer: Self-pay

## 2023-01-18 ENCOUNTER — Other Ambulatory Visit: Payer: Self-pay

## 2023-01-30 NOTE — Progress Notes (Unsigned)
New Patient Office Visit  Subjective:  Patient ID: Patrick King, male    DOB: 05/18/1975  Age: 47 y.o. MRN: [id number]  CC:  No chief complaint on file.   HPI ALPHONS ROZON presents for primary care .   01/05/2021 Patient is from [place] but has been in this country for many years.  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  Patient does carry diagnosis of glaucoma and is on 2 different eyedrops and oral Diamox and followed closely by general ophthalmology.  Patient states his vision is at baseline.  On arrival blood pressure is good at 128/80.  Patient does complain of chronic belching but denies any other heartburn or other related GI symptoms.  He has not been treated for this in the past.  Patient has no other complaints at this time.  There is a history of hypertension that runs in his mother side of the family  6/28 Patient seen in return visit complaining of continued eructation that is persistent.  He is following a lactose-free diet he has tried eliminate most of the common precipitants of excess upper intestinal gas.  He has no lower intestinal gas.  He has had this problem since 2007.  He has not seen gastroenterology as of yet.  Reflux medications are of no benefit.  Patient had not gotten his health screening labs at the last visit in November will need to be ordered at this visit  03/23/22 this patient is seen in follow-up from a visit in June of last year.  He is still having belching and burping.  We referred him to gastroenterology but he did not answer his phone and the appointment that was never made. The patient also failed to do his colon cancer screening kit will now get this through gastroenterology Patient is followed by ophthalmology for ocular hypertension he would like his eyedrops transferred to our clinic pharmacy   01/31/23  Past Medical History:  Diagnosis Date   Glaucoma     Past Surgical History:  Procedure Laterality Date    EYE SURGERY      Family History  Problem Relation Age of Onset   Hypertension Mother    Colon cancer Neg Hx    Stomach cancer Neg Hx    Esophageal cancer Neg Hx     Social History   Socioeconomic History   Marital status: Single    Spouse name: Not on file   Number of children: 3   Years of education: Not on file   Highest education level: Not on file  Occupational History   Occupation: unemployed  Tobacco Use   Smoking status: Never   Smokeless tobacco: Never  Vaping Use   Vaping status: Never Used  Substance and Sexual Activity   Alcohol use: No   Drug use: No   Sexual activity: Yes  Other Topics Concern   Not on file  Social History Narrative   Not on file   Social Drivers of Health   Financial Resource Strain: Not on file  Food Insecurity: Not on file  Transportation Needs: Not on file  Physical Activity: Not on file  Stress: Not on file  Social Connections: Not on file  Intimate Partner Violence: Not on file    ROS Review of Systems  Constitutional: Negative.  Negative for chills, diaphoresis and fever.  HENT: Negative.  Negative for congestion, hearing loss, nosebleeds, sore throat and tinnitus.   Eyes:  Positive  for redness and visual disturbance. Negative for photophobia and pain.  Respiratory: Negative.  Negative for cough, shortness of breath, wheezing and stridor.   Cardiovascular: Negative.  Negative for chest pain, palpitations and leg swelling.  Gastrointestinal:  Negative for abdominal distention, abdominal pain, anal bleeding, blood in stool, constipation, diarrhea, nausea, rectal pain and vomiting.       Belching frequent  Endocrine: Negative for polydipsia.  Genitourinary: Negative.  Negative for dysuria, flank pain, frequency, hematuria and urgency.  Musculoskeletal: Negative.  Negative for back pain, myalgias and neck pain.  Skin: Negative.  Negative for rash.  Allergic/Immunologic: Negative for environmental allergies.  Neurological:  Negative.  Negative for dizziness, tremors, seizures, syncope, weakness and headaches.  Hematological: Negative.  Does not bruise/bleed easily.  Psychiatric/Behavioral: Negative.  Negative for dysphoric mood, sleep disturbance and suicidal ideas. The patient is not nervous/anxious.     Objective:   Today's Vitals: There were no vitals taken for this visit.  Physical Exam Vitals reviewed.  Constitutional:      Appearance: Normal appearance. He is well-developed. He is obese. He is not diaphoretic.  HENT:     Head: Normocephalic and atraumatic.     Nose: No nasal deformity, septal deviation, mucosal edema or rhinorrhea.     Right Sinus: No maxillary sinus tenderness or frontal sinus tenderness.     Left Sinus: No maxillary sinus tenderness or frontal sinus tenderness.     Mouth/Throat:     Pharynx: No oropharyngeal exudate.  Eyes:     General: No scleral icterus.    Conjunctiva/sclera: Conjunctivae normal.     Pupils: Pupils are equal, round, and reactive to light.  Neck:     Thyroid: No thyromegaly.     Vascular: No carotid bruit or JVD.     Trachea: Trachea normal. No tracheal tenderness or tracheal deviation.  Cardiovascular:     Rate and Rhythm: Normal rate and regular rhythm.     Chest Wall: PMI is not displaced.     Pulses: Normal pulses. No decreased pulses.     Heart sounds: Normal heart sounds, S1 normal and S2 normal. Heart sounds not distant. No murmur heard.    No systolic murmur is present.     No diastolic murmur is present.     No friction rub. No gallop. No S3 or S4 sounds.  Pulmonary:     Effort: Pulmonary effort is normal. No tachypnea, accessory muscle usage or respiratory distress.     Breath sounds: Normal breath sounds. No stridor. No decreased breath sounds, wheezing, rhonchi or rales.  Chest:     Chest wall: No tenderness.  Abdominal:     General: Bowel sounds are normal. There is no distension.     Palpations: Abdomen is soft. Abdomen is not rigid.  There is no mass.     Tenderness: There is abdominal tenderness. There is no guarding or rebound.     Hernia: No hernia is present.     Comments: Patient had a large amount of gas with belching when I compressed his stomach and left lower quadrant tenderness without rebound  Musculoskeletal:        General: Normal range of motion.     Cervical back: Normal range of motion and neck supple. No edema, erythema or rigidity. No muscular tenderness. Normal range of motion.  Lymphadenopathy:     Head:     Right side of head: No submental or submandibular adenopathy.     Left side of head: No  submental or submandibular adenopathy.     Cervical: No cervical adenopathy.  Skin:    General: Skin is warm and dry.     Coloration: Skin is not pale.     Findings: No rash.     Nails: There is no clubbing.  Neurological:     Mental Status: He is alert and oriented to person, place, and time.     Sensory: No sensory deficit.  Psychiatric:        Mood and Affect: Mood normal.        Speech: Speech normal.        Behavior: Behavior normal.        Thought Content: Thought content normal.        Judgment: Judgment normal.     Assessment & Plan:   Problem List Items Addressed This Visit   None   Outpatient Encounter Medications as of 02/01/2023  Medication Sig   acetaZOLAMIDE ER (DIAMOX) 500 MG capsule Take 1 capsule (500 mg total) by mouth 2 (two) times daily.   acetaZOLAMIDE ER (DIAMOX) 500 MG capsule Take 1 capsule (500 mg total) by mouth 2 (two) times daily.   atorvastatin (LIPITOR) 20 MG tablet Take 1 tablet (20 mg total) by mouth daily.   brimonidine (ALPHAGAN P) 0.1 % SOLN Place 1 drop into both eyes 3 (three) times daily.   brimonidine (ALPHAGAN P) 0.1 % SOLN Place 1 drop into both eyes 3 (three) times daily.   brimonidine (ALPHAGAN P) 0.1 % SOLN Place 1 drop into both eyes 3 (three) times daily as directed.   brimonidine (ALPHAGAN P) 0.1 % SOLN Place 1 drop into the right eye 3 (three)  times daily.   Difluprednate 0.05 % EMUL Place 1 drop into the left eye 4 (four) times daily.   dorzolamide-timolol (COSOPT) 2-0.5 % ophthalmic solution Place 1 drop into both eyes 2 (two) times daily.   dorzolamide-timolol (COSOPT) 2-0.5 % ophthalmic solution Place 1 drop into the right eye 2 (two) times daily.   lidocaine (LIDODERM) 5 % Place 1 patch onto the skin daily. Remove & Discard patch within 12 hours or as directed by MD   Netarsudil-Latanoprost (ROCKLATAN) 0.02-0.005 % SOLN Place 1 drop into both eyes Nightly.   Netarsudil-Latanoprost (ROCKLATAN) 0.02-0.005 % SOLN Instill 1 drop into both eyes every night as directed   PEG-KCl-NaCl-NaSulf-Na Asc-C (PLENVU) 140 g SOLR Take 1 kit by mouth as directed.   pilocarpine (PILOCAR) 2 % ophthalmic solution Place 1 drop into both eyes 3 (three) times daily.   pilocarpine (PILOCAR) 2 % ophthalmic solution Place 1 drop into both eyes 3 (three) times daily as needed.   pilocarpine (PILOCAR) 2 % ophthalmic solution Place 1 drop into the right eye 3 (three) times daily as needed.   prednisoLONE acetate (PRED FORTE) 1 % ophthalmic suspension Place 1 drop into the left eye 4 (four) times daily.Start 1 week before surgery.   prednisoLONE acetate (PRED FORTE) 1 % ophthalmic suspension Place 1 drop into the left eye 4 (four) times daily. Start 1 week before surgery   prednisoLONE acetate (PRED FORTE) 1 % ophthalmic suspension Place 1 drop into the left eye 4 (four) times daily. Start 1 week before surgery   trimethoprim-polymyxin b (POLYTRIM) ophthalmic solution Place 1 drop into the left eye 4 (four) times daily.   No facility-administered encounter medications on file as of 02/01/2023.    Follow-up: 4 mo  Shan Levans, MD

## 2023-02-01 ENCOUNTER — Other Ambulatory Visit: Payer: Self-pay

## 2023-02-01 ENCOUNTER — Ambulatory Visit: Payer: Self-pay | Attending: Critical Care Medicine | Admitting: Critical Care Medicine

## 2023-02-01 ENCOUNTER — Encounter: Payer: Self-pay | Admitting: Critical Care Medicine

## 2023-02-01 VITALS — BP 102/67 | HR 64 | Temp 97.5°F | Ht 69.0 in | Wt 200.0 lb

## 2023-02-01 DIAGNOSIS — H4010X3 Unspecified open-angle glaucoma, severe stage: Secondary | ICD-10-CM

## 2023-02-01 DIAGNOSIS — M545 Low back pain, unspecified: Secondary | ICD-10-CM

## 2023-02-01 DIAGNOSIS — H401133 Primary open-angle glaucoma, bilateral, severe stage: Secondary | ICD-10-CM

## 2023-02-01 DIAGNOSIS — E782 Mixed hyperlipidemia: Secondary | ICD-10-CM

## 2023-02-01 DIAGNOSIS — N1832 Chronic kidney disease, stage 3b: Secondary | ICD-10-CM

## 2023-02-01 MED ORDER — LIDOCAINE 5 % EX PTCH
1.0000 | MEDICATED_PATCH | CUTANEOUS | 2 refills | Status: AC
  Filled 2023-02-01: qty 30, 30d supply, fill #0
  Filled 2023-04-04: qty 30, 30d supply, fill #1
  Filled 2023-05-22: qty 30, 30d supply, fill #2
  Filled 2023-08-24: qty 30, 30d supply, fill #3

## 2023-02-01 MED ORDER — DORZOLAMIDE HCL-TIMOLOL MAL 2-0.5 % OP SOLN: 1.0000 [drp] | Freq: Two times a day (BID) | OPHTHALMIC | Status: AC

## 2023-02-01 MED ORDER — ATORVASTATIN CALCIUM 20 MG PO TABS
20.0000 mg | ORAL_TABLET | Freq: Every day | ORAL | 3 refills | Status: AC
  Filled 2023-02-01: qty 90, 90d supply, fill #0

## 2023-02-01 NOTE — Assessment & Plan Note (Signed)
Care per ophthalmology

## 2023-02-01 NOTE — Assessment & Plan Note (Signed)
Reassess labs 

## 2023-02-01 NOTE — Assessment & Plan Note (Signed)
Continue with statin check labs

## 2023-02-01 NOTE — Assessment & Plan Note (Signed)
No indication for imaging will plan topical lidocaine and gave patient back exercises

## 2023-02-01 NOTE — Assessment & Plan Note (Signed)
Management per ophthalmology.   

## 2023-02-01 NOTE — Patient Instructions (Addendum)
Stay on cholesterol pill Continue care with eye doctor Do back exercises as attached Labs today Flu vaccine was given Return 6 months for new primary care doctor

## 2023-02-02 LAB — CMP14+EGFR
ALT: 36 [IU]/L (ref 0–44)
AST: 36 [IU]/L (ref 0–40)
Albumin: 4.6 g/dL (ref 4.1–5.1)
Alkaline Phosphatase: 60 [IU]/L (ref 44–121)
BUN/Creatinine Ratio: 13 (ref 9–20)
BUN: 20 mg/dL (ref 6–24)
Bilirubin Total: 0.6 mg/dL (ref 0.0–1.2)
CO2: 24 mmol/L (ref 20–29)
Calcium: 9.8 mg/dL (ref 8.7–10.2)
Chloride: 101 mmol/L (ref 96–106)
Creatinine, Ser: 1.5 mg/dL — ABNORMAL HIGH (ref 0.76–1.27)
Globulin, Total: 2.9 g/dL (ref 1.5–4.5)
Glucose: 92 mg/dL (ref 70–99)
Potassium: 4.8 mmol/L (ref 3.5–5.2)
Sodium: 140 mmol/L (ref 134–144)
Total Protein: 7.5 g/dL (ref 6.0–8.5)
eGFR: 58 mL/min/{1.73_m2} — ABNORMAL LOW (ref >59)

## 2023-02-02 LAB — CBC WITH DIFFERENTIAL/PLATELET
Basophils Absolute: 0 10*3/uL (ref 0.0–0.2)
Basos: 1 %
EOS (ABSOLUTE): 0 10*3/uL (ref 0.0–0.4)
Eos: 0 %
Hematocrit: 48.9 % (ref 37.5–51.0)
Hemoglobin: 16.1 g/dL (ref 13.0–17.7)
Immature Grans (Abs): 0 10*3/uL (ref 0.0–0.1)
Immature Granulocytes: 0 %
Lymphocytes Absolute: 2.3 10*3/uL (ref 0.7–3.1)
Lymphs: 50 %
MCH: 29.8 pg (ref 26.6–33.0)
MCHC: 32.9 g/dL (ref 31.5–35.7)
MCV: 90 fL (ref 79–97)
Monocytes Absolute: 0.5 10*3/uL (ref 0.1–0.9)
Monocytes: 11 %
Neutrophils Absolute: 1.7 10*3/uL (ref 1.4–7.0)
Neutrophils: 38 %
Platelets: 204 10*3/uL (ref 150–450)
RBC: 5.41 x10E6/uL (ref 4.14–5.80)
RDW: 12.2 % (ref 11.6–15.4)
WBC: 4.4 10*3/uL (ref 3.4–10.8)

## 2023-02-02 LAB — LIPID PANEL
Chol/HDL Ratio: 2.1 {ratio} (ref 0.0–5.0)
Cholesterol, Total: 168 mg/dL (ref 100–199)
HDL: 79 mg/dL (ref >39)
LDL Chol Calc (NIH): 79 mg/dL (ref 0–99)
Triglycerides: 46 mg/dL (ref 0–149)
VLDL Cholesterol Cal: 10 mg/dL (ref 5–40)

## 2023-02-02 NOTE — Progress Notes (Signed)
Let pt know all labs normal. Cbolesterol at goal. Kidney much improved

## 2023-02-20 ENCOUNTER — Other Ambulatory Visit: Payer: Self-pay

## 2023-02-20 ENCOUNTER — Ambulatory Visit: Payer: Self-pay | Admitting: *Deleted

## 2023-02-20 NOTE — Telephone Encounter (Signed)
  Chief Complaint: Pt called in and was given his lab results from Dr. Belvie Silvan Symptoms: N/A Frequency: N/A Pertinent Negatives: Patient denies N/A Disposition: [] ED /[] Urgent Care (no appt availability in office) / [] Appointment(In office/virtual)/ []  [facility]/ [x] Home Care/ [] Refused Recommended Disposition /[] [place]/ []  Follow-up with PCP Additional Notes: No questions from pt.

## 2023-02-20 NOTE — Telephone Encounter (Signed)
 Reason for Disposition  [1] Follow-up call to recent contact AND [2] information only call, no triage required  Answer Assessment - Initial Assessment Questions 1. REASON FOR CALL or QUESTION: What is your reason for calling today? or How can I best help you? or What question do you have that I can help answer?     Pt given lab results per notes of Dr Belvie Silvan on 02/02/2023 at 1:48 PM..   Pt verbalized understanding.  Protocols used: Information Only Call - No Triage-A-AH

## 2023-02-21 ENCOUNTER — Other Ambulatory Visit: Payer: Self-pay

## 2023-02-22 ENCOUNTER — Other Ambulatory Visit: Payer: Self-pay

## 2023-03-07 ENCOUNTER — Other Ambulatory Visit: Payer: Self-pay

## 2023-03-07 MED ORDER — DIFLUPREDNATE 0.05 % OP EMUL
1.0000 [drp] | Freq: Two times a day (BID) | OPHTHALMIC | 0 refills | Status: DC
  Filled 2023-03-07: qty 5, 50d supply, fill #0

## 2023-03-08 ENCOUNTER — Other Ambulatory Visit: Payer: Self-pay

## 2023-03-08 ENCOUNTER — Other Ambulatory Visit (HOSPITAL_COMMUNITY): Payer: Self-pay

## 2023-03-24 ENCOUNTER — Other Ambulatory Visit (HOSPITAL_COMMUNITY): Payer: Self-pay

## 2023-03-24 ENCOUNTER — Other Ambulatory Visit: Payer: Self-pay

## 2023-04-04 ENCOUNTER — Other Ambulatory Visit: Payer: Self-pay

## 2023-04-06 ENCOUNTER — Other Ambulatory Visit: Payer: Self-pay

## 2023-04-06 MED ORDER — DIFLUPREDNATE 0.05 % OP EMUL
1.0000 [drp] | Freq: Two times a day (BID) | OPHTHALMIC | 0 refills | Status: DC
Start: 2023-04-06 — End: 2023-05-22
  Filled 2023-04-06: qty 5, 50d supply, fill #0

## 2023-04-10 ENCOUNTER — Other Ambulatory Visit: Payer: Self-pay

## 2023-04-11 ENCOUNTER — Other Ambulatory Visit: Payer: Self-pay

## 2023-04-25 ENCOUNTER — Other Ambulatory Visit: Payer: Self-pay

## 2023-04-26 ENCOUNTER — Other Ambulatory Visit: Payer: Self-pay

## 2023-04-27 ENCOUNTER — Other Ambulatory Visit: Payer: Self-pay

## 2023-05-01 ENCOUNTER — Other Ambulatory Visit: Payer: Self-pay

## 2023-05-22 ENCOUNTER — Other Ambulatory Visit (HOSPITAL_COMMUNITY): Payer: Self-pay

## 2023-05-22 ENCOUNTER — Other Ambulatory Visit: Payer: Self-pay

## 2023-05-22 MED ORDER — DIFLUPREDNATE 0.05 % OP EMUL
1.0000 [drp] | Freq: Two times a day (BID) | OPHTHALMIC | 1 refills | Status: DC
  Filled 2023-05-22: qty 5, 50d supply, fill #0
  Filled 2023-06-30: qty 5, 50d supply, fill #1

## 2023-05-23 ENCOUNTER — Other Ambulatory Visit: Payer: Self-pay

## 2023-05-24 ENCOUNTER — Other Ambulatory Visit: Payer: Self-pay

## 2023-06-19 ENCOUNTER — Other Ambulatory Visit: Payer: Self-pay

## 2023-06-30 ENCOUNTER — Other Ambulatory Visit: Payer: Self-pay

## 2023-06-30 ENCOUNTER — Other Ambulatory Visit (HOSPITAL_BASED_OUTPATIENT_CLINIC_OR_DEPARTMENT_OTHER): Payer: Self-pay

## 2023-06-30 ENCOUNTER — Other Ambulatory Visit (HOSPITAL_COMMUNITY): Payer: Self-pay

## 2023-07-06 ENCOUNTER — Other Ambulatory Visit: Payer: Self-pay

## 2023-07-07 ENCOUNTER — Other Ambulatory Visit: Payer: Self-pay

## 2023-08-02 ENCOUNTER — Ambulatory Visit: Payer: Self-pay | Admitting: Family Medicine

## 2023-08-02 ENCOUNTER — Telehealth: Payer: Self-pay | Admitting: Critical Care Medicine

## 2023-08-02 NOTE — Telephone Encounter (Signed)
 Contacted pt to resch appt phone busy

## 2023-08-14 ENCOUNTER — Other Ambulatory Visit: Payer: Self-pay

## 2023-08-14 ENCOUNTER — Other Ambulatory Visit (HOSPITAL_COMMUNITY): Payer: Self-pay

## 2023-08-15 ENCOUNTER — Other Ambulatory Visit: Payer: Self-pay

## 2023-08-15 MED ORDER — BRIMONIDINE TARTRATE 0.1 % OP SOLN
1.0000 [drp] | Freq: Two times a day (BID) | OPHTHALMIC | 4 refills | Status: AC
  Filled 2023-08-15: qty 5, 50d supply, fill #0

## 2023-08-15 MED ORDER — PREDNISOLONE ACETATE 1 % OP SUSP
1.0000 [drp] | Freq: Every day | OPHTHALMIC | 1 refills | Status: AC
  Filled 2023-08-15: qty 5, 100d supply, fill #0

## 2023-08-16 ENCOUNTER — Other Ambulatory Visit (HOSPITAL_COMMUNITY): Payer: Self-pay

## 2023-08-16 ENCOUNTER — Other Ambulatory Visit: Payer: Self-pay

## 2023-08-21 ENCOUNTER — Other Ambulatory Visit (HOSPITAL_COMMUNITY): Payer: Self-pay

## 2023-08-21 ENCOUNTER — Other Ambulatory Visit: Payer: Self-pay

## 2023-08-21 MED ORDER — DIFLUPREDNATE 0.05 % OP EMUL: 1.0000 [drp] | Freq: Two times a day (BID) | OPHTHALMIC | 3 refills | Status: AC

## 2023-08-22 ENCOUNTER — Other Ambulatory Visit: Payer: Self-pay

## 2023-08-24 ENCOUNTER — Other Ambulatory Visit: Payer: Self-pay

## 2023-08-28 ENCOUNTER — Other Ambulatory Visit: Payer: Self-pay

## 2023-09-01 ENCOUNTER — Ambulatory Visit: Payer: Self-pay | Admitting: Nurse Practitioner
